# Patient Record
Sex: Male | Born: 1965
Health system: Southern US, Community
[De-identification: ages and names within clinical notes are randomized; demographics above are authoritative.]

## PROBLEM LIST (undated history)

## (undated) DIAGNOSIS — Z87442 Personal history of urinary calculi: Secondary | ICD-10-CM

## (undated) DIAGNOSIS — E785 Hyperlipidemia, unspecified: Secondary | ICD-10-CM

## (undated) DIAGNOSIS — K219 Gastro-esophageal reflux disease without esophagitis: Secondary | ICD-10-CM

## (undated) DIAGNOSIS — M549 Dorsalgia, unspecified: Secondary | ICD-10-CM

## (undated) DIAGNOSIS — M1990 Unspecified osteoarthritis, unspecified site: Secondary | ICD-10-CM

## (undated) DIAGNOSIS — J302 Other seasonal allergic rhinitis: Secondary | ICD-10-CM

## (undated) DIAGNOSIS — H9319 Tinnitus, unspecified ear: Secondary | ICD-10-CM

## (undated) DIAGNOSIS — Z8619 Personal history of other infectious and parasitic diseases: Secondary | ICD-10-CM

## (undated) DIAGNOSIS — T7840XA Allergy, unspecified, initial encounter: Secondary | ICD-10-CM

## (undated) HISTORY — PX: TONSILLECTOMY: SUR1361

## (undated) HISTORY — DX: Personal history of other infectious and parasitic diseases: Z86.19

## (undated) HISTORY — DX: Hyperlipidemia, unspecified: E78.5

## (undated) HISTORY — DX: Allergy, unspecified, initial encounter: T78.40XA

---

## 2014-03-19 ENCOUNTER — Encounter: Payer: Self-pay | Admitting: Emergency Medicine

## 2014-03-19 ENCOUNTER — Emergency Department (INDEPENDENT_AMBULATORY_CARE_PROVIDER_SITE_OTHER)
Admission: EM | Admit: 2014-03-19 | Discharge: 2014-03-19 | Disposition: A | Discharge status: Home / Self Care | Payer: 59 | Source: Home / Self Care | Attending: Family Medicine | Admitting: Family Medicine

## 2014-03-19 DIAGNOSIS — J069 Acute upper respiratory infection, unspecified: Secondary | ICD-10-CM

## 2014-03-19 DIAGNOSIS — J029 Acute pharyngitis, unspecified: Secondary | ICD-10-CM

## 2014-03-19 HISTORY — DX: Dorsalgia, unspecified: M54.9

## 2014-03-19 HISTORY — DX: Other seasonal allergic rhinitis: J30.2

## 2014-03-19 HISTORY — DX: Tinnitus, unspecified ear: H93.19

## 2014-03-19 LAB — POCT RAPID STREP A (OFFICE): Rapid Strep A Screen: NEGATIVE

## 2014-03-19 MED ORDER — AZITHROMYCIN 250 MG PO TABS: ORAL_TABLET | ORAL | Status: DC

## 2014-03-19 NOTE — ED Provider Notes (Signed)
CSN: [id number]     Arrival [id number] & time 03/19/14  1103 History   First MD Initiated Contact with Patient 03/19/14 1232     Chief Complaint  Patient presents with  . Sore Throat  . Nasal Congestion      HPI Comments: Patient complains of two day history of sore throat, congestion, and cough.  The history is provided by the patient.    Past Medical History  Diagnosis Date  . Back ache   . Tinnitus   . Seasonal allergies    Past Surgical History  Procedure Laterality Date  . Tonsillectomy     Family History  Problem Relation Age of Onset  . Cancer Father   . Cancer Sister    History  Substance Use Topics  . Smoking status: Never Smoker   . Smokeless tobacco: Not on file  . Alcohol Use: No    Review of Systems + sore throat + cough No pleuritic pain No wheezing No nasal congestion ? post-nasal drainage No sinus pain/pressure No itchy/red eyes No earache No hemoptysis No SOB No fever/chills No nausea No vomiting No abdominal pain No diarrhea No urinary symptoms No skin rash + fatigue No myalgias No headache Used OTC meds without relief  Allergies  Review of patient's allergies indicates no known allergies.  Home Medications   Prior to Admission medications   Medication Sig Start Date End Date Taking? Authorizing Provider  azithromycin (ZITHROMAX Z-PAK) 250 MG tablet Take 2 tabs today; then begin one tab once daily for 4 more days. (Rx void after 03/27/14) 03/19/14   Lattie HawStephen A Velecia Ovitt, MD   Pulse 82  Temp(Src) 98.4 F (36.9 C) (Oral)  Ht 5\' 8"  (1.727 m)  Wt 212 lb (96.163 kg)  BMI 32.24 kg/m2  SpO2 95% Physical Exam Nursing notes and Vital Signs reviewed. Appearance:  Patient appears stated age, and in no acute distress.  Patient is obese (BMI 32.2) Eyes:  Pupils are equal, round, and reactive to light and accomodation.  Extraocular movement is intact.  Conjunctivae are not inflamed  Ears:  Canals normal.  Tympanic membranes normal.  Nose:  Mildly  congested turbinates.  No sinus tenderness.   Pharynx:   Minimal erythema Neck:  Supple.  Tender shotty posterior nodes are palpated bilaterally  Lungs:  Clear to auscultation.  Breath sounds are equal.  Heart:  Regular rate and rhythm without murmurs, rubs, or gallops.  Abdomen:  Nontender without masses or hepatosplenomegaly.  Bowel sounds are present.  No CVA or flank tenderness.  Extremities:  No edema.  No calf tenderness Skin:  No rash present.   ED Course  Procedures  none    Labs Reviewed  STREP A DNA PROBE  POCT RAPID STREP A (OFFICE) negative         MDM   1. Viral URI   2. Acute pharyngitis, unspecified pharyngitis type    Throat culture pending.  There is no evidence of bacterial infection today.     Take plain Mucinex (1200 mg guaifenesin) twice daily for cough and congestion.  May add Sudafed for sinus congestion.   Increase fluid intake, rest. May use Afrin nasal spray (or generic oxymetazoline) twice daily for about 5 days.  Also recommend using saline nasal spray several times daily and saline nasal irrigation (AYR is a common brand) Try warm salt water gargles for sore throat.  May take Delsym Cough Suppressant at bedtime for nighttime cough.  Stop all antihistamines for now, and other non-prescription  cough/cold preparations. May take Ibuprofen 200mg , 4 tabs every 8 hours with food for sore throat, fever, etc. Begin Azithromycin if not improving about one week or if persistent fever develops (Given a prescription to hold, with an expiration date)  Follow-up with family doctor if not improving about10 days.     Lattie Haw, MD 03/23/14 1451

## 2014-03-19 NOTE — Discharge Instructions (Signed)
Take plain Mucinex (1200 mg guaifenesin) twice daily for cough and congestion.  May add Sudafed for sinus congestion.   Increase fluid intake, rest. May use Afrin nasal spray (or generic oxymetazoline) twice daily for about 5 days.  Also recommend using saline nasal spray several times daily and saline nasal irrigation (AYR is a common brand) Try warm salt water gargles for sore throat.  May take Delsym Cough Suppressant at bedtime for nighttime cough.  Stop all antihistamines for now, and other non-prescription cough/cold preparations. May take Ibuprofen 200mg , 4 tabs every 8 hours with food for sore throat, fever, etc. Begin Azithromycin if not improving about one week or if persistent fever develops  Follow-up with family doctor if not improving about10 days.    Salt Water Gargle This solution will help make your mouth and throat feel better. HOME CARE INSTRUCTIONS   Mix 1 teaspoon of salt in 8 ounces of warm water.  Gargle with this solution as much or often as you need or as directed. Swish and gargle gently if you have any sores or wounds in your mouth.  Do not swallow this mixture. Document Released: 05/17/2004 Document Revised: 11/05/2011 Document Reviewed: 10/08/2008 [facility] Patient Information 2015 [employer], [place]. This information is not intended to replace advice given to you by your health care provider. Make sure you discuss any questions you have with your health care provider.

## 2014-03-19 NOTE — ED Notes (Signed)
Reports 2 days of sore throat, congestion and cough.

## 2014-03-20 LAB — STREP A DNA PROBE: GASP: NEGATIVE

## 2014-03-22 ENCOUNTER — Telehealth: Payer: Self-pay

## 2014-03-22 NOTE — ED Notes (Signed)
I spoke with patient and he is feeling only a little better. I advised him if he continues to feel bad to start the Zpack as directed by Dr Cathren HarshBeese. I advised for him to call us back if he has any questions or concerns.

## 2016-03-27 ENCOUNTER — Encounter: Payer: Self-pay | Admitting: Osteopathic Medicine

## 2016-03-27 ENCOUNTER — Ambulatory Visit (INDEPENDENT_AMBULATORY_CARE_PROVIDER_SITE_OTHER): Payer: PRIVATE HEALTH INSURANCE | Admitting: Osteopathic Medicine

## 2016-03-27 VITALS — BP 137/86 | HR 91 | Ht 68.0 in | Wt 220.0 lb

## 2016-03-27 DIAGNOSIS — IMO0002 Reserved for concepts with insufficient information to code with codable children: Secondary | ICD-10-CM

## 2016-03-27 DIAGNOSIS — Z0000 Encounter for general adult medical examination without abnormal findings: Secondary | ICD-10-CM

## 2016-03-27 DIAGNOSIS — E668 Other obesity: Secondary | ICD-10-CM

## 2016-03-27 DIAGNOSIS — M545 Low back pain, unspecified: Secondary | ICD-10-CM

## 2016-03-27 DIAGNOSIS — R946 Abnormal results of thyroid function studies: Secondary | ICD-10-CM

## 2016-03-27 DIAGNOSIS — R7989 Other specified abnormal findings of blood chemistry: Secondary | ICD-10-CM

## 2016-03-27 DIAGNOSIS — J309 Allergic rhinitis, unspecified: Secondary | ICD-10-CM

## 2016-03-27 MED ORDER — FLUTICASONE PROPIONATE 50 MCG/ACT NA SUSP: NASAL | 11 refills | Status: DC

## 2016-03-27 NOTE — Progress Notes (Signed)
HPI: Carlos Potts is a 50 y.o. Not Hispanic or Latino male  who presents to [facility] today, 03/27/16,  for chief complaint of:  Chief Complaint  Patient presents with  . Establish Care    ALLERGIES, COLD    Very pleasant new patient here to establish care. Has some concerns about allergies and sinus issues as well as arthritis  Allergies, sinus pressure: Patient has been dealing with venous/sinus congestion on and off for the past several weeks. Trying to manage as below with Claritin-D. Getting some relief but seems to be coming and going. No new residential or occupational allergy exposure that he is aware of. Reports possible intermittent wheezing, notices this a bit more night  Arthritis: Patient reports some chronic low back pain, not really bothering him at the moment, he admits that he could be better about exercise and is looking to lose weight. No history of major injury  Past medical, surgical, social and family history reviewed: Past Medical History:  Diagnosis Date  . Back ache   . Seasonal allergies   . Tinnitus    Past Surgical History:  Procedure Laterality Date  . TONSILLECTOMY     Social History  Substance Use Topics  . Smoking status: Never Smoker  . Smokeless tobacco: Not on file  . Alcohol use No   Family History  Problem Relation Age of Onset  . Cancer Father   . Cancer Sister      Current medication list and allergy/intolerance information reviewed:   Current Outpatient Prescriptions  Medication Sig Dispense Refill  . desloratadine-pseudoephedrine (CLARINEX-D 24-HOUR) 5-240 MG 24 hr tablet Take 1 tablet by mouth daily.    Marland Kitchen ibuprofen (ADVIL,MOTRIN) 600 MG tablet Take 600 mg by mouth every 6 (six) hours as needed.    . vitamin C (ASCORBIC ACID) 500 MG tablet Take 500 mg by mouth daily.     No current facility-administered medications for this visit.    No Known Allergies    Review of  Systems:  Constitutional:  No  fever, no chills, No recent illness, No unintentional weight changes. No significant fatigue.   HEENT: No  headache, no vision change, no hearing change, No sore throat, (+) sinus pressure, (+) tinnitus, (+) hay fever/allergies  Cardiac: No  chest pain, No  pressure, No palpitations, No  Orthopnea  Respiratory:  No  shortness of breath. No  Cough, (+) wheeze  Gastrointestinal: No  abdominal pain, No  nausea, No  vomiting,  No  blood in stool, No  diarrhea, No  constipation   Musculoskeletal: No new myalgia/arthralgia  Genitourinary: No  incontinence, No  abnormal genital bleeding, No abnormal genital discharge  Skin: No  Rash, No other wounds/concerning lesions  Hem/Onc: No  easy bruising/bleeding, No  abnormal lymph node  Endocrine: No cold intolerance,  No heat intolerance. No polyuria/polydipsia/polyphagia   Neurologic: No  weakness, No  dizziness, No  slurred speech/focal weakness/facial droop  Psychiatric: No  concerns with depression, No  concerns with anxiety, No sleep problems, No mood problems  Exam:  BP 137/86   Pulse 91   Ht 5\' 8"  (1.727 m)   Wt 220 lb (99.8 kg)   BMI 33.45 kg/m   Constitutional: VS see above. General Appearance: alert, well-developed, well-nourished, NAD  Eyes: Normal lids and conjunctive, non-icteric sclera  Ears, Nose, Mouth, Throat: MMM, Normal external inspection ears/nares/mouth/lips/gums. TM normal bilaterally. Pharynx/tonsils no erythema, no exudate. Nasal mucosa mildly pale.   Neck: No masses,  trachea midline. No thyroid enlargement. No tenderness/mass appreciated. No lymphadenopathy  Respiratory: Normal respiratory effort. no wheeze, no rhonchi, no rales  Cardiovascular: S1/S2 normal, no murmur, no rub/gallop auscultated. RRR. No lower extremity edema.    Musculoskeletal: Gait normal. No clubbing/cyanosis of digits.   Neurological:  Normal balance/coordination. No tremor.   Skin: warm, dry, intact.    Psychiatric: Normal judgment/insight. Normal mood and affect. Oriented x3.      ASSESSMENT/PLAN:   Due to intermittent nature of sinus symptoms, more suspicious for allergic rhinitis rather than infectious cause at this point. Go ahead and add nasal steroid and addition to antihistamine/decongestant. If no improvement on this regimen, could consider course of antibiotics after reevaluation  Patient advised on questioning thinning exercises to alleviate/prevent back pain. Weight loss encouraged, abdominal obesity can certainly contribute to back problems.  Labs as below for upcoming annual physical, annual preventive care exam was not billed today  Allergic rhinitis, unspecified allergic rhinitis type - Plan: desloratadine-pseudoephedrine (CLARINEX-D 24-HOUR) 5-240 MG 24 hr tablet, fluticasone (FLONASE) 50 MCG/ACT nasal spray  Bilateral low back pain without sciatica - Plan: ibuprofen (ADVIL,MOTRIN) 600 MG tablet  Adult BMI > 30  Annual physical exam - Plan: CBC with Differential/Platelet, COMPLETE METABOLIC PANEL WITH GFR, Lipid panel, Hepatitis C antibody, TSH     Visit summary with medication list and pertinent instructions was printed for patient to review. All questions at time of visit were answered - patient instructed to contact office with any additional concerns. ER/RTC precautions were reviewed with the patient. Follow-up plan: Return if symptoms worsen or fail to improve, and at your convenience for annual physical exam.  Note: Total time spent 30 minutes, greater than 50% of the visit was spent face-to-face counseling and coordinating care for the following: The primary encounter diagnosis was Allergic rhinitis, unspecified allergic rhinitis type. Diagnoses of Bilateral low back pain without sciatica, Adult BMI > 30, were also pertinent to this visit.Marland Kitchen

## 2016-03-28 DIAGNOSIS — M545 Low back pain, unspecified: Secondary | ICD-10-CM | POA: Insufficient documentation

## 2016-03-28 DIAGNOSIS — IMO0002 Reserved for concepts with insufficient information to code with codable children: Secondary | ICD-10-CM | POA: Insufficient documentation

## 2016-03-28 DIAGNOSIS — J309 Allergic rhinitis, unspecified: Secondary | ICD-10-CM | POA: Insufficient documentation

## 2016-04-02 LAB — COMPLETE METABOLIC PANEL WITH GFR
ALT: 31 U/L (ref 9–46)
AST: 20 U/L (ref 10–35)
Albumin: 4.6 g/dL (ref 3.6–5.1)
Alkaline Phosphatase: 73 U/L (ref 40–115)
BILIRUBIN TOTAL: 0.5 mg/dL (ref 0.2–1.2)
BUN: 16 mg/dL (ref 7–25)
CO2: 28 mmol/L (ref 20–31)
Calcium: 9.8 mg/dL (ref 8.6–10.3)
Chloride: 101 mmol/L (ref 98–110)
Creat: 0.91 mg/dL (ref 0.70–1.33)
GFR, Est African American: >89 mL/min (ref >=60)
GLUCOSE: 95 mg/dL (ref 65–99)
POTASSIUM: 4.6 mmol/L (ref 3.5–5.3)
SODIUM: 139 mmol/L (ref 135–146)
Total Protein: 7.2 g/dL (ref 6.1–8.1)

## 2016-04-02 LAB — CBC WITH DIFFERENTIAL/PLATELET
Basophils Absolute: 80 cells/uL (ref 0–200)
Basophils Relative: 1 %
Eosinophils Absolute: 320 cells/uL (ref 15–500)
Eosinophils Relative: 4 %
HCT: 45.8 % (ref 38.5–50.0)
Hemoglobin: 15.2 g/dL (ref 13.2–17.1)
Lymphocytes Relative: 34 %
Lymphs Abs: 2720 cells/uL (ref 850–3900)
MCH: 29.5 pg (ref 27.0–33.0)
MCHC: 33.2 g/dL (ref 32.0–36.0)
MCV: 88.9 fL (ref 80.0–100.0)
MONOS PCT: 9 %
MPV: 10.7 fL (ref 7.5–12.5)
Monocytes Absolute: 720 cells/uL (ref 200–950)
NEUTROS PCT: 52 %
Neutro Abs: 4160 cells/uL (ref 1500–7800)
PLATELETS: 250 10*3/uL (ref 140–400)
RBC: 5.15 MIL/uL (ref 4.20–5.80)
RDW: 13.7 % (ref 11.0–15.0)
WBC: 8 10*3/uL (ref 3.8–10.8)

## 2016-04-02 LAB — LIPID PANEL
Cholesterol: 169 mg/dL (ref 125–200)
HDL: 42 mg/dL (ref >=40)
LDL CALC: 101 mg/dL (ref <130)
TRIGLYCERIDES: 128 mg/dL (ref <150)
Total CHOL/HDL Ratio: 4 Ratio (ref <=5.0)
VLDL: 26 mg/dL (ref <30)

## 2016-04-02 LAB — TSH: TSH: 5.06 mIU/L — ABNORMAL HIGH (ref 0.40–4.50)

## 2016-04-03 LAB — HEPATITIS C ANTIBODY: HCV Ab: NEGATIVE

## 2016-04-03 NOTE — Addendum Note (Signed)
Addended by: Deirdre PippinsALEXANDER, Ashford Clouse M on: 04/03/2016 08:22 AM   Modules accepted: Orders

## 2016-04-04 LAB — T4, FREE: Free T4: 1 ng/dL (ref 0.8–1.8)

## 2016-04-04 LAB — T3, FREE: T3, Free: 3.1 pg/mL (ref 2.3–4.2)

## 2016-04-27 ENCOUNTER — Encounter: Payer: Self-pay | Admitting: Osteopathic Medicine

## 2016-04-27 ENCOUNTER — Ambulatory Visit (INDEPENDENT_AMBULATORY_CARE_PROVIDER_SITE_OTHER): Payer: PRIVATE HEALTH INSURANCE | Admitting: Osteopathic Medicine

## 2016-04-27 VITALS — BP 124/78 | HR 79 | Ht 68.0 in | Wt 219.0 lb

## 2016-04-27 DIAGNOSIS — R946 Abnormal results of thyroid function studies: Secondary | ICD-10-CM | POA: Diagnosis not present

## 2016-04-27 DIAGNOSIS — Z0000 Encounter for general adult medical examination without abnormal findings: Secondary | ICD-10-CM

## 2016-04-27 DIAGNOSIS — R7989 Other specified abnormal findings of blood chemistry: Secondary | ICD-10-CM | POA: Insufficient documentation

## 2016-04-27 DIAGNOSIS — Z23 Encounter for immunization: Secondary | ICD-10-CM | POA: Diagnosis not present

## 2016-04-27 NOTE — Progress Notes (Signed)
HPI: Carlos Potts is a 50 y.o. male  who presents to [place] today, 04/27/16,  for chief complaint of:  Chief Complaint  Patient presents with  . Annual Exam    Preventive care reviewed as below - no complaints today. Allergies are better.   ABNORMAL THYROID TESTING: TSH mild high, T4/T3 ok. No extreme fatigue, constipation, cold intolerance, hair/skin changes.    Past medical, surgical, social and family history reviewed: Past Medical History:  Diagnosis Date  . Back ache   . Seasonal allergies   . Tinnitus    Past Surgical History:  Procedure Laterality Date  . TONSILLECTOMY     Social History  Substance Use Topics  . Smoking status: Never Smoker  . Smokeless tobacco: Never Used  . Alcohol use No   Family History  Problem Relation Age of Onset  . Cancer Father   . Cancer Sister      Current medication list and allergy/intolerance information reviewed:   Current Outpatient Prescriptions  Medication Sig Dispense Refill  . desloratadine-pseudoephedrine (CLARINEX-D 24-HOUR) 5-240 MG 24 hr tablet Take 1 tablet by mouth daily.    . fluticasone (FLONASE) 50 MCG/ACT nasal spray Place 2 sprays into each nostril once or twice daily for allergic rhinitis 16 g 11  . ibuprofen (ADVIL,MOTRIN) 600 MG tablet Take 600 mg by mouth every 6 (six) hours as needed.    . vitamin C (ASCORBIC ACID) 500 MG tablet Take 500 mg by mouth daily.     No current facility-administered medications for this visit.    No Known Allergies    Review of Systems:  Constitutional:  No  fever, no chills, No recent illness, No unintentional weight changes. No significant fatigue.   HEENT: No  sinus pressure  Cardiac: No  chest pain, No  pressure, No palpitations  Respiratory:  No  shortness of breath. No  Cough  Gastrointestinal: No  abdominal pain  Musculoskeletal: No new myalgia/arthralgia  Skin: No  Rash  Hem/Onc: No  easy bruising/bleeding, No   abnormal lymph node  Endocrine: No cold intolerance,  No heat intolerance. No polyuria/polydipsia/polyphagia   Neurologic: No  weakness, No  dizziness,   Psychiatric: No  concerns with depression, No  concerns with anxiety, (+)sleep problems, No mood problems  Exam:  BP 124/78   Pulse 79   Ht '5\' 8"'$  (1.727 m)   Wt 219 lb (99.3 kg)   BMI 33.30 kg/m   Constitutional: VS see above. General Appearance: alert, well-developed, well-nourished, NAD  Eyes: Normal lids and conjunctive, non-icteric sclera  Ears, Nose, Mouth, Throat: MMM, Normal external inspection ears/nares/mouth/lips/gums.  Neck: No masses, trachea midline. No thyroid enlargement. No tenderness/mass appreciated. No lymphadenopathy  Respiratory: Normal respiratory effort. no wheeze, no rhonchi, no rales  Cardiovascular: S1/S2 normal, no murmur, no rub/gallop auscultated. RRR. No lower extremity edema.   Skin: warm, dry, intact. No rash/ulcer.   Psychiatric: Normal judgment/insight. Normal mood and affect. Oriented x3.    Recent Results (from the past 2160 hour(s))  CBC with Differential/Platelet     Status: None   Collection Time: 04/02/16  8:06 AM  Result Value Ref Range   WBC 8.0 3.8 - 10.8 K/uL   RBC 5.15 4.20 - 5.80 MIL/uL   Hemoglobin 15.2 13.2 - 17.1 g/dL   HCT 45.8 38.5 - 50.0 %   MCV 88.9 80.0 - 100.0 fL   MCH 29.5 27.0 - 33.0 pg   MCHC 33.2 32.0 - 36.0 g/dL  RDW 13.7 11.0 - 15.0 %   Platelets 250 140 - 400 K/uL   MPV 10.7 7.5 - 12.5 fL   Neutro Abs 4,160 1,500 - 7,800 cells/uL   Lymphs Abs 2,720 850 - 3,900 cells/uL   Monocytes Absolute 720 200 - 950 cells/uL   Eosinophils Absolute 320 15 - 500 cells/uL   Basophils Absolute 80 0 - 200 cells/uL   Neutrophils Relative % 52 %   Lymphocytes Relative 34 %   Monocytes Relative 9 %   Eosinophils Relative 4 %   Basophils Relative 1 %   Smear Review Criteria for review not met     Comment: ** Please note change in unit of measure and reference  range(s). **  COMPLETE METABOLIC PANEL WITH GFR     Status: None   Collection Time: 04/02/16  8:06 AM  Result Value Ref Range   Sodium 139 135 - 146 mmol/L   Potassium 4.6 3.5 - 5.3 mmol/L   Chloride 101 98 - 110 mmol/L   CO2 28 20 - 31 mmol/L   Glucose, Bld 95 65 - 99 mg/dL   BUN 16 7 - 25 mg/dL   Creat 0.91 0.70 - 1.33 mg/dL    Comment:   For patients > or = 52 years of age: The upper reference limit for Creatinine is approximately 13% higher for people identified as African-American.      Total Bilirubin 0.5 0.2 - 1.2 mg/dL   Alkaline Phosphatase 73 40 - 115 U/L   AST 20 10 - 35 U/L   ALT 31 9 - 46 U/L   Total Protein 7.2 6.1 - 8.1 g/dL   Albumin 4.6 3.6 - 5.1 g/dL   Calcium 9.8 8.6 - 10.3 mg/dL   GFR, Est African American >89 >=60 mL/min   GFR, Est Non African American >89 >=60 mL/min  Lipid panel     Status: None   Collection Time: 04/02/16  8:06 AM  Result Value Ref Range   Cholesterol 169 125 - 200 mg/dL   Triglycerides 128 <150 mg/dL   HDL 42 >=40 mg/dL   Total CHOL/HDL Ratio 4.0 <=5.0 Ratio   VLDL 26 <30 mg/dL   LDL Cholesterol 101 <130 mg/dL    Comment:   Total Cholesterol/HDL Ratio:CHD Risk                        Coronary Heart Disease Risk Table                                        Men       Women          1/2 Average Risk              3.4        3.3              Average Risk              5.0        4.4           2X Average Risk              9.6        7.1           3X Average Risk             23.4  11.0 Use the calculated Patient Ratio above and the CHD Risk table  to determine the patient's CHD Risk.   Hepatitis C antibody     Status: None   Collection Time: 04/02/16  8:06 AM  Result Value Ref Range   HCV Ab NEGATIVE NEGATIVE  TSH     Status: Abnormal   Collection Time: 04/02/16  8:06 AM  Result Value Ref Range   TSH 5.06 (H) 0.40 - 4.50 mIU/L  T4, free     Status: None   Collection Time: 04/03/16  8:20 AM  Result Value Ref Range   Free T4  1.0 0.8 - 1.8 ng/dL  T3, free     Status: None   Collection Time: 04/03/16  8:20 AM  Result Value Ref Range   T3, Free 3.1 2.3 - 4.2 pg/mL     ASSESSMENT/PLAN:   Annual physical exam  Need for diphtheria-tetanus-pertussis (Tdap) vaccine, adult/adolescent - Plan: Tdap vaccine greater than or equal to 7yo IM  Abnormal thyroid blood test - Plan: Thyroid Panel With TSH, Thyroid peroxidase antibody, Thyroglobulin Antibody Panel   MALE PREVENTIVE CARE updated 04/27/16  ANNUAL SCREENING/COUNSELING  Any changes to health in the past year? no  Tobacco - never   Alcohol - none  Diet/Exercise - Healthy habits discussed to decrease CV risk and promote overall health. Patient does not have dietary restrictions.   Depression - PQH2 Negative  Feel safe at home? - yes  HTN SCREENING - SEE [place]  Sexually active? - Yes with male.  STI testing needed/desired today? - no  Any concerns with testosterone/libido? - no  INFECTIOUS DISEASE SCREENING  HIV - does not need  GC/CT - does not need  HepC - does not need  TB - does not need  CANCER SCREENING  Lung - does not need  Colon - needs - patient prefers fecal FIT, would like to check with insurance prior to ordering this to make sure covered  Prostate - does not need  OTHER DISEASE SCREENING  Lipid - does not need  DM2 - does not need  Osteoporosis - does not need  ADULT VACCINATION  Influenza - needs today, annual vaccine recommended  Td - was given  Zoster - was not indicated  Pneumonia - was not indicated     Visit summary with medication list and pertinent instructions was printed for patient to review. All questions at time of visit were answered - patient instructed to contact office with any additional concerns. ER/RTC precautions were reviewed with the patient. Follow-up plan: Return in about 4 weeks (around 05/25/2016) for LABS - FOLLOW UP THYROID .

## 2016-07-27 ENCOUNTER — Encounter: Payer: Self-pay | Admitting: Osteopathic Medicine

## 2016-07-27 ENCOUNTER — Other Ambulatory Visit: Payer: Self-pay | Admitting: Osteopathic Medicine

## 2016-07-27 ENCOUNTER — Ambulatory Visit (INDEPENDENT_AMBULATORY_CARE_PROVIDER_SITE_OTHER): Payer: PRIVATE HEALTH INSURANCE | Admitting: Osteopathic Medicine

## 2016-07-27 VITALS — BP 124/76 | HR 73 | Ht 68.0 in | Wt 227.0 lb

## 2016-07-27 DIAGNOSIS — M19042 Primary osteoarthritis, left hand: Secondary | ICD-10-CM | POA: Diagnosis not present

## 2016-07-27 DIAGNOSIS — H0015 Chalazion left lower eyelid: Secondary | ICD-10-CM

## 2016-07-27 DIAGNOSIS — R7989 Other specified abnormal findings of blood chemistry: Secondary | ICD-10-CM

## 2016-07-27 DIAGNOSIS — M19041 Primary osteoarthritis, right hand: Secondary | ICD-10-CM

## 2016-07-27 DIAGNOSIS — R946 Abnormal results of thyroid function studies: Secondary | ICD-10-CM | POA: Diagnosis not present

## 2016-07-27 LAB — T4, FREE: Free T4: 1.1 ng/dL (ref 0.8–1.8)

## 2016-07-27 LAB — T3, FREE: T3, Free: 3.8 pg/mL (ref 2.3–4.2)

## 2016-07-27 NOTE — Patient Instructions (Signed)
Eye: I think this is a resolved infection, I don't think we need to drain anything from this. Continue the warm compresses and eyelid washing with baby shampoo as discussed in the office. Ophthalmology referral has been placed if this doesn't go away in the next 1 - 2 weeks, if it does go away you can cancel that appointment.   Arthritis: OK to continue Ibuprofen as needed and can add Tylenol [id number] mg (max 1000 mg 4 times per day).   Thyroid: Will call you with lab results and next steps!     Happy holidays! Let us know if there is anything else we can do for you!

## 2016-07-27 NOTE — Progress Notes (Signed)
HPI: Carlos PostRobert Cressler is a 50 y.o. male  who presents to [facility] today, 07/27/16,  for chief complaint of:  Chief Complaint  Patient presents with  . Stye    left eye    L eye problem . Context: No previous episodes of similar . Location: Left lower eyelid . Quality: Currently hard, round. Nontender/nonpainful. Nondraining. Patient states it was much larger and painful/inflamed but this improves with warm compresses over the first week or 2. He stops doing warm compresses and it has not fully resolved . Duration: 3 weeks total, has been stable for the past 1 week or so . Modifying factors: Warm compresses initially were helping . Assoc signs/symptoms: No vision changes, no eye pain,  Arthritis . Location: Mostly hands/fingers. Worse in left second digit at PIP  . Quality: Left second PIP, occasionally feels like it's locking in the morning dose he can spontaneously move it. Otherwise, sore intermittently.  . Timing:No morning stiffness. Pain worse with activity. . Context: No previous injury . Modifying factors: Ibuprofen few times a week, typically 400-600 mg  Abnormal thyroid: TSH mild elevated on routine labs, free T4 and T3 were fine, patient due for recheck labs. Reviewed labs with him today and asked Lane diagnosis of subclinical hypothyroidism versus incidental lab abnormality      Past medical, surgical, social and family history reviewed: Patient Active Problem List   Diagnosis Date Noted  . Abnormal thyroid blood test 04/27/2016  . Rhinitis, allergic 03/28/2016  . Bilateral low back pain without sciatica 03/28/2016  . Adult BMI > 30 03/28/2016   Past Surgical History:  Procedure Laterality Date  . TONSILLECTOMY     Social History  Substance Use Topics  . Smoking status: Never Smoker  . Smokeless tobacco: Never Used  . Alcohol use No   Family History  Problem Relation Age of Onset  . Cancer Father   . Cancer Sister       Current medication list and allergy/intolerance information reviewed:   Current Outpatient Prescriptions on File Prior to Visit  Medication Sig Dispense Refill  . desloratadine-pseudoephedrine (CLARINEX-D 24-HOUR) 5-240 MG 24 hr tablet Take 1 tablet by mouth daily.    . fluticasone (FLONASE) 50 MCG/ACT nasal spray Place 2 sprays into each nostril once or twice daily for allergic rhinitis 16 g 11  . ibuprofen (ADVIL,MOTRIN) 600 MG tablet Take 600 mg by mouth every 6 (six) hours as needed.    . vitamin C (ASCORBIC ACID) 500 MG tablet Take 500 mg by mouth daily.     No current facility-administered medications on file prior to visit.    No Known Allergies    Review of Systems:  Constitutional: No recent illness  HEENT: No  headache, no vision change  Cardiac: No  chest pain, No  pressure  Respiratory:  No  shortness of breath. No  Cough  Musculoskeletal: No new myalgia/arthralgia, +chronic arthritis pain  Skin: No  Rash except as noted HPI eyelid   Exam:  BP 124/76   Pulse 73   Ht 5\' 8"  (1.727 m)   Wt 227 lb (103 kg)   BMI 34.52 kg/m   Constitutional: VS see above. General Appearance: alert, well-developed, well-nourished, NAD  Eyes: Normal conjunctive, non-icteric sclera. Subcutaneous nodule left lower lid laterally. Nondraining. Nontender. No erythema. No edema. Internal lid appears small healing area on the opposite side of external nodule.  Ears, Nose, Mouth, Throat: MMM, Normal external inspection ears/nares/mouth/lips/gums.  Neck: No masses,  trachea midline.   Respiratory: Normal respiratory effort. no wheeze, no rhonchi, no rales  Cardiovascular: S1/S2 normal, no murmur, no rub/gallop auscultated. RRR.   Musculoskeletal: Gait normal. Symmetric and independent movement of all extremities. Normal range of motion in hand/finger joints. No joint effusion.  Neurological: Normal balance/coordination. No tremor.  Skin: warm, dry, intact.   Psychiatric: Normal  judgment/insight. Normal mood and affect. Oriented x3.    ASSESSMENT/PLAN:   Chalazion left lower eyelid - Plan: Ambulatory referral to Ophthalmology  Osteoarthritis of both hands, unspecified osteoarthritis type  Abnormal thyroid blood test    Patient Instructions  Eye: I think this is a resolved infection, I don't think we need to drain anything from this. Continue the warm compresses and eyelid washing with baby shampoo as discussed in the office. Ophthalmology referral has been placed if this doesn't go away in the next 1 - 2 weeks, if it does go away you can cancel that appointment.   Arthritis: OK to continue Ibuprofen as needed and can add Tylenol [phone] mg (max 1000 mg 4 times per day).   Thyroid: Will call you with lab results and next steps!     Happy holidays! Let us know if there is anything else we can do for you!      Visit summary with medication list and pertinent instructions was printed for patient to review. All questions at time of visit were answered - patient instructed to contact office with any additional concerns. ER/RTC precautions were reviewed with the patient. Follow-up plan: Return if symptoms worsen or fail to improve and depending on labs.

## 2016-08-17 ENCOUNTER — Telehealth: Payer: Self-pay

## 2016-08-17 NOTE — Telephone Encounter (Signed)
Patient called stated that the sty is getting bigger on his eye so he is requesting a referral for eye specialist. Please advise. Rhonda Cunningham,CMA

## 2016-08-21 NOTE — Telephone Encounter (Signed)
Ok can send urgent referral to Ophthalmology for dx stye

## 2016-11-10 ENCOUNTER — Encounter: Payer: Self-pay | Admitting: Emergency Medicine

## 2016-11-10 ENCOUNTER — Emergency Department
Admission: EM | Admit: 2016-11-10 | Discharge: 2016-11-10 | Disposition: A | Discharge status: Home / Self Care | Payer: PRIVATE HEALTH INSURANCE | Source: Home / Self Care | Attending: Family Medicine | Admitting: Family Medicine

## 2016-11-10 DIAGNOSIS — R112 Nausea with vomiting, unspecified: Secondary | ICD-10-CM | POA: Diagnosis not present

## 2016-11-10 DIAGNOSIS — R197 Diarrhea, unspecified: Secondary | ICD-10-CM

## 2016-11-10 HISTORY — DX: Unspecified osteoarthritis, unspecified site: M19.90

## 2016-11-10 MED ORDER — ONDANSETRON 4 MG PO TBDP
ORAL_TABLET | ORAL | 0 refills | Status: DC
Start: 2016-11-10 — End: 2016-11-22

## 2016-11-10 MED ORDER — ONDANSETRON 4 MG PO TBDP
4.0000 mg | ORAL_TABLET | Freq: Once | ORAL | Status: AC
  Administered 2016-11-10: 4 mg via ORAL

## 2016-11-10 NOTE — ED Triage Notes (Signed)
Pt c/o diarrhea, vomitting, started last night, all night with cold sweats, ate dinner around 6pm then started with sxs around 11:45pm.  Discomfort in abdomen and leg cramping.

## 2016-11-10 NOTE — Discharge Instructions (Signed)

## 2016-11-10 NOTE — ED Provider Notes (Signed)
[facility]    CSN: [id number] Arrival date & time: 11/10/16  0919     History   Chief Complaint Chief Complaint  Patient presents with  . Diarrhea    HPI Carlos Potts is a 51 y.o. male.   At about 11:30pm yesterday, patient developed watery diarrhea followed by two other episodes during the night.  He developed nausea, and vomited three times during the night.  He had another episode of diarrhea at 7am.  He is able to take fluids.  He had chills, fatigue, and leg cramps during the night.   He has had colicky abdominal discomfort.  No urinary symptoms.  Denies recent foreign travel, or drinking untreated water in a wilderness environment.  He denies recent antibiotic use.    The history is provided by the patient and the spouse.  Diarrhea  Quality:  Watery Severity:  Mild Onset quality:  Sudden Number of episodes:  3 Timing:  Sporadic Progression:  Partially resolved Relieved by:  None tried Worsened by:  Nothing Ineffective treatments:  None tried Associated symptoms: abdominal pain, chills, diaphoresis and vomiting   Associated symptoms: no arthralgias, no recent cough, no fever, no headaches, no myalgias and no URI   Risk factors: no recent antibiotic use, no sick contacts, no suspicious food intake and no travel to endemic areas     Past Medical History:  Diagnosis Date  . Arthritis   . Back ache   . Seasonal allergies   . Tinnitus     Patient Active Problem List   Diagnosis Date Noted  . Chalazion left lower eyelid 07/27/2016  . Osteoarthritis of both hands 07/27/2016  . Abnormal thyroid blood test 04/27/2016  . Rhinitis, allergic 03/28/2016  . Bilateral low back pain without sciatica 03/28/2016  . Adult BMI > 30 03/28/2016    Past Surgical History:  Procedure Laterality Date  . TONSILLECTOMY         Home Medications    Prior to Admission medications   Medication Sig Start Date End Date Taking? Authorizing Provider  ibuprofen  (ADVIL,MOTRIN) 600 MG tablet Take 600 mg by mouth every 6 (six) hours as needed.    Historical Provider, MD  ondansetron (ZOFRAN ODT) 4 MG disintegrating tablet Take one tab by mouth Q6hr prn nausea.  Dissolve under tongue. 11/10/16   Lattie Haw, MD    Family History Family History  Problem Relation Age of Onset  . Cancer Father   . Cancer Sister     Social History Social History  Substance Use Topics  . Smoking status: Never Smoker  . Smokeless tobacco: Never Used  . Alcohol use No     Allergies   Patient has no known allergies.   Review of Systems Review of Systems  Constitutional: Positive for chills and diaphoresis. Negative for fever.  HENT: Negative.   Eyes: Negative.   Respiratory: Negative.   Cardiovascular: Negative.   Gastrointestinal: Positive for abdominal distention, abdominal pain, diarrhea, nausea and vomiting. Negative for blood in stool and rectal pain.  Genitourinary: Negative.   Musculoskeletal: Negative for arthralgias and myalgias.  Neurological: Negative for headaches.  All other systems reviewed and are negative.    Physical Exam Triage Vital Signs ED Triage Vitals  Enc Vitals Group     BP 11/10/16 0953 130/89     Pulse Rate 11/10/16 0953 (!) 114     Resp --      Temp 11/10/16 0953 98.7 F (37.1 C)  Temp Source 11/10/16 0953 Oral     SpO2 11/10/16 0953 96 %     Weight 11/10/16 0953 224 lb 4 oz (101.7 kg)     Height 11/10/16 0953 5\' 8"  (1.727 m)     Head Circumference --      Peak Flow --      Pain Score 11/10/16 0954 3     Pain Loc --      Pain Edu? --      Excl. in GC? --    No data found.   Updated Vital Signs BP 130/89 (BP Location: Left Arm)   Pulse (!) 114   Temp 98.7 F (37.1 C) (Oral)   Ht 5\' 8"  (1.727 m)   Wt 224 lb 4 oz (101.7 kg)   SpO2 96%   BMI 34.10 kg/m   Visual Acuity Right Eye Distance:   Left Eye Distance:   Bilateral Distance:    Right Eye Near:   Left Eye Near:    Bilateral Near:      Physical Exam Nursing notes and Vital Signs reviewed. Appearance:  Patient appears stated age, and in no acute distress Eyes:  Pupils are equal, round, and reactive to light and accomodation.  Extraocular movement is intact.  Conjunctivae are not inflamed   Nose:   Normal Pharynx:  Normal; moist mucous membranes  Neck:  Supple.   No adenopathy. Lungs:  Clear to auscultation.  Breath sounds are equal.  Moving air well. Heart:  Regular rate and rhythm without murmurs, rubs, or gallops.  Abdomen:  Nontender without masses or hepatosplenomegaly.  Bowel sounds are present.  No CVA or flank tenderness.  Extremities:  No edema.  Skin:  No rash present.    UC Treatments / Results  Labs (all labs ordered are listed, but only abnormal results are displayed) Labs Reviewed - No data to display  EKG  EKG Interpretation None       Radiology No results found.  Procedures Procedures (including critical care time)  Medications Ordered in UC Medications  ondansetron (ZOFRAN-ODT) disintegrating tablet 4 mg (not administered)     Initial Impression / Assessment and Plan / UC Course  I have reviewed the triage vital signs and the nursing notes.  Pertinent labs & imaging results that were available during my care of the patient were reviewed by me and considered in my medical decision making (see chart for details).    Suspect viral gastroenteritis. Administered Zofran ODT 4mg  PO.  Given Rx for same. Begin clear liquids (Pedialyte while having diarrhea) until improved, then advance to a [employer] (Bananas, Rice, Applesauce, Toast).  Then gradually resume a regular diet when tolerated.  Avoid milk products until well.  To decrease diarrhea, mix one teaspoon Citrucel (methylcellulose) in 2 oz water and drink one to three times daily.  Do not drink extra fluids with this dose and do not drink fluids for one hour afterwards.  When stools become more formed, may take Imodium (loperamide) once  or twice daily to decrease stool frequency.  If symptoms become significantly worse during the night or over the weekend, proceed to the local emergency room.  Followup with Family Doctor if not improved in about 4 days    Final Clinical Impressions(s) / UC Diagnoses   Final diagnoses:  Nausea vomiting and diarrhea    New Prescriptions New Prescriptions   ONDANSETRON (ZOFRAN ODT) 4 MG DISINTEGRATING TABLET    Take one tab by mouth Q6hr prn nausea.  Dissolve  under tongue.     Lattie HawStephen A Beese, MD 11/10/16 [phone]

## 2016-11-12 ENCOUNTER — Telehealth: Payer: Self-pay

## 2016-11-12 NOTE — Telephone Encounter (Signed)
Feeling better, symptoms are resolved and has started eating more solid foods.

## 2016-11-22 ENCOUNTER — Encounter: Payer: Self-pay | Admitting: *Deleted

## 2016-11-22 ENCOUNTER — Emergency Department
Admission: EM | Admit: 2016-11-22 | Discharge: 2016-11-22 | Disposition: A | Discharge status: Home / Self Care | Payer: PRIVATE HEALTH INSURANCE | Source: Home / Self Care | Attending: Family Medicine | Admitting: Family Medicine

## 2016-11-22 DIAGNOSIS — J101 Influenza due to other identified influenza virus with other respiratory manifestations: Secondary | ICD-10-CM

## 2016-11-22 DIAGNOSIS — R252 Cramp and spasm: Secondary | ICD-10-CM

## 2016-11-22 LAB — POCT CBC W AUTO DIFF (K'VILLE URGENT CARE)

## 2016-11-22 LAB — POCT INFLUENZA A/B
Influenza A, POC: NEGATIVE
Influenza B, POC: POSITIVE — AB

## 2016-11-22 MED ORDER — OSELTAMIVIR PHOSPHATE 75 MG PO CAPS: 75.0000 mg | ORAL_CAPSULE | Freq: Two times a day (BID) | ORAL | 0 refills | Status: DC

## 2016-11-22 MED ORDER — CYCLOBENZAPRINE HCL 5 MG PO TABS: 5.0000 mg | ORAL_TABLET | Freq: Three times a day (TID) | ORAL | 0 refills | Status: DC | PRN

## 2016-11-22 MED ORDER — GUAIFENESIN-CODEINE 100-10 MG/5ML PO SYRP: 5.0000 mL | ORAL_SOLUTION | Freq: Three times a day (TID) | ORAL | 0 refills | Status: DC | PRN

## 2016-11-22 NOTE — ED Provider Notes (Signed)
CSN: [id number]     Arrival date & time 11/22/16  4098 History   First MD Initiated Contact with Patient 11/22/16 0845     Chief Complaint  Patient presents with  . Back Pain  . Cough  . Leg Pain  . Muscle Cramping   (Consider location/radiation/quality/duration/timing/severity/associated sxs/prior Treatment) HPI  Carlos Potts is a 51 y.o. male presenting to UC with c/o 3 days of low grade fever Tmax 100.6*F, mild intermittent productive cough with intermittent lower back muscle spasms and bilateral leg cramping.  Pt was seen at [facility] 1 week ago for GI symptoms and leg cramps.  The GI symptoms resolved but leg cramps are still present.  He has been eating a bland diet and drinking Pedialyte.  He recently went out of town for work and when he came back he felt sick again.  Denies known sick contacts. Denies getting the flu vaccine this year.  He has been taking Excedrin and ibuprofen with mild temporary relief.    Past Medical History:  Diagnosis Date  . Arthritis   . Back ache   . Seasonal allergies   . Tinnitus    Past Surgical History:  Procedure Laterality Date  . TONSILLECTOMY     Family History  Problem Relation Age of Onset  . Cancer Father   . Cancer Sister    Social History  Substance Use Topics  . Smoking status: Never Smoker  . Smokeless tobacco: Never Used  . Alcohol use No    Review of Systems  Constitutional: Positive for fatigue and fever. Negative for chills.  HENT: Positive for congestion and rhinorrhea. Negative for ear pain, sore throat, trouble swallowing and voice change.   Respiratory: Positive for cough. Negative for shortness of breath.   Cardiovascular: Negative for chest pain and palpitations.  Gastrointestinal: Negative for abdominal pain, diarrhea, nausea and vomiting.  Musculoskeletal: Positive for back pain and myalgias. Negative for arthralgias.  Skin: Negative for rash.  Neurological: Positive for headaches. Negative for dizziness and  light-headedness.    Allergies  Patient has no known allergies.  Home Medications   Prior to Admission medications   Medication Sig Start Date End Date Taking? Authorizing Provider  cyclobenzaprine (FLEXERIL) 5 MG tablet Take 1 tablet (5 mg total) by mouth 3 (three) times daily as needed for muscle spasms. 11/22/16   Junius Finner, PA-C  guaiFENesin-codeine (ROBITUSSIN AC) 100-10 MG/5ML syrup Take 5-10 mLs by mouth 3 (three) times daily as needed for cough or congestion. 11/22/16   Junius Finner, PA-C  ibuprofen (ADVIL,MOTRIN) 600 MG tablet Take 600 mg by mouth every 6 (six) hours as needed.    Historical Provider, MD  oseltamivir (TAMIFLU) 75 MG capsule Take 1 capsule (75 mg total) by mouth every 12 (twelve) hours. 11/22/16   Junius Finner, PA-C   Meds Ordered and Administered this Visit  Medications - No data to display  BP 119/85 (BP Location: Left Arm)   Pulse (!) 103   Temp 98.6 F (37 C) (Oral)   Resp 16   Wt 216 lb (98 kg)   SpO2 96%   BMI 32.84 kg/m  No data found.   Physical Exam  Constitutional: He appears well-developed and well-nourished. No distress.  HENT:  Head: Normocephalic and atraumatic.  Right Ear: Tympanic membrane normal.  Left Ear: Tympanic membrane normal.  Nose: Nose normal.  Mouth/Throat: Uvula is midline, oropharynx is clear and moist and mucous membranes are normal.  Eyes: Conjunctivae are normal. No scleral icterus.  Neck: Normal range of motion. Neck supple.  Cardiovascular: Normal rate, regular rhythm and normal heart sounds.   Pulmonary/Chest: Effort normal and breath sounds normal. No stridor. No respiratory distress. He has no wheezes. He has no rales.  Abdominal: Soft. He exhibits no distension and no mass. There is no tenderness. There is no rebound and no guarding.  Musculoskeletal: Normal range of motion. He exhibits tenderness ( lower lumbar muscles, Left side > Right ). He exhibits no edema.  Lymphadenopathy:    He has no cervical  adenopathy.  Neurological: He is alert.  Skin: Skin is warm and dry. He is not diaphoretic.  Nursing note and vitals reviewed.   Urgent Care Course     Procedures (including critical care time)  Labs Review Labs Reviewed  POCT INFLUENZA A/B - Abnormal; Notable for the following:       Result Value   Influenza B, POC Positive (*)    All other components within normal limits  COMPLETE METABOLIC PANEL WITH GFR  SEDIMENTATION RATE  C-REACTIVE PROTEIN  POCT CBC W AUTO DIFF ([facility])    Imaging Review No results found.   MDM   1. Influenza B   2. Muscle cramps    Rapid flu: POSITIVE B Labs: CBC- WNL,  CMP, Sed Rate, and CRP- pending (performed due to continued c/w bilateral leg cramping)   Rx: Tamiflu, flexeril, and Virtussin Encouraged to use caution when taking flexeril and Virtussin around same time due to risk of drowsiness. Advised not to drive or drink alcohol while taking.   Encouraged fluids and rest. f/u with PCP in 1 week if not improving, sooner if worsening.      Junius FinnerErin O'Malley, PA-C 11/22/16 [phone]

## 2016-11-22 NOTE — ED Triage Notes (Signed)
Patient c/o 3 days of low grade fever and cough, productive at times. Also c/o back pain and pain in his legs with cramping. He has had this leg cramping for >1 week.

## 2016-11-22 NOTE — Discharge Instructions (Signed)
°  Virtussin (guaifenesin-codeine) is a strong narcotic cough medication.  Only take up to 3 times daily as needed for severe cough.  Be sure to take with large glass of water.  It may cause drowsiness. Do not drive, drink alcohol or take other sedating medications such as Nyquil while taking this medication.   Oseltamivir (Tamiflu) is an antiviral medication that can help decrease symptoms of the flu by about 1 days and lessen severity of symptoms.  It is best to start this medication within first 48 hours of symptoms, however, some studies have shown relief even after the 48 hour time frame.  This medication may cause stomach upset including nausea, vomiting and diarrhea.  It may also cause dizziness or hallucinations in children.  To help prevent stomach upset, you may take this medication with food.  If you are still having unwanted symptoms, you may stop taking this medication as it is not as important to finish the entire course like antibiotics.  If you have questions/concerns please call our office or follow up with your primary care provider.

## 2016-11-23 ENCOUNTER — Telehealth: Payer: Self-pay | Admitting: *Deleted

## 2016-11-23 LAB — COMPLETE METABOLIC PANEL WITH GFR
ALT: 75 U/L — ABNORMAL HIGH (ref 9–46)
AST: 54 U/L — ABNORMAL HIGH (ref 10–35)
Albumin: 4.7 g/dL (ref 3.6–5.1)
Alkaline Phosphatase: 70 U/L (ref 40–115)
BUN: 11 mg/dL (ref 7–25)
CO2: 28 mmol/L (ref 20–31)
Calcium: 9.3 mg/dL (ref 8.6–10.3)
Chloride: 98 mmol/L (ref 98–110)
Creat: 1.13 mg/dL (ref 0.70–1.33)
GFR, Est African American: 86 mL/min (ref >=60)
GFR, Est Non African American: 74 mL/min (ref >=60)
Glucose, Bld: 107 mg/dL — ABNORMAL HIGH (ref 65–99)
Potassium: 5.1 mmol/L (ref 3.5–5.3)
Sodium: 136 mmol/L (ref 135–146)
Total Bilirubin: 0.5 mg/dL (ref 0.2–1.2)
Total Protein: 7.8 g/dL (ref 6.1–8.1)

## 2016-11-23 LAB — C-REACTIVE PROTEIN: CRP: 19.4 mg/L — ABNORMAL HIGH (ref <8.0)

## 2016-11-23 LAB — SEDIMENTATION RATE: Sed Rate: 7 mm/hr (ref 0–20)

## 2016-11-23 NOTE — Telephone Encounter (Signed)
Callback: Patient reports he is still fatigued and lightheaded at times, decreased appetite. Labs results given and discussed. Encouraged f/u with PCP when well to ensure labs have normalized. Encouraged fluids.

## 2016-11-23 NOTE — Telephone Encounter (Signed)
-----   Message from Lattie Haw, MD sent at 11/23/2016  4:04 PM EDT ----- Mildly elevated AST and ALT most likely a result of recent viral infection.  Elevated CRP non-specific; recommend follow-up with PCP.

## 2016-12-09 ENCOUNTER — Emergency Department
Admission: EM | Admit: 2016-12-09 | Discharge: 2016-12-09 | Disposition: A | Discharge status: Home / Self Care | Payer: PRIVATE HEALTH INSURANCE | Source: Home / Self Care | Attending: Family Medicine | Admitting: Family Medicine

## 2016-12-09 ENCOUNTER — Encounter: Payer: Self-pay | Admitting: Emergency Medicine

## 2016-12-09 DIAGNOSIS — B9789 Other viral agents as the cause of diseases classified elsewhere: Secondary | ICD-10-CM

## 2016-12-09 DIAGNOSIS — J069 Acute upper respiratory infection, unspecified: Secondary | ICD-10-CM | POA: Diagnosis not present

## 2016-12-09 MED ORDER — CYCLOBENZAPRINE HCL 5 MG PO TABS: 5.0000 mg | ORAL_TABLET | Freq: Three times a day (TID) | ORAL | 0 refills | Status: DC | PRN

## 2016-12-09 MED ORDER — BENZONATATE 200 MG PO CAPS: ORAL_CAPSULE | ORAL | 0 refills | Status: DC

## 2016-12-09 MED ORDER — AZITHROMYCIN 250 MG PO TABS: ORAL_TABLET | ORAL | 0 refills | Status: DC

## 2016-12-09 NOTE — Discharge Instructions (Signed)
Take plain guaifenesin (  extended release tabs such as Mucinex) twice daily, with plenty of water, for cough and congestion.  May add Phenylephrine as needed for sinus congestion.  Get adequate rest.   May use Afrin nasal spray (or generic oxymetazoline) each morning for about 5 days and then discontinue.  Also recommend using saline nasal spray several times daily and saline nasal irrigation (AYR is a common brand).  Use Flonase nasal spray each morning after using Afrin nasal spray and saline nasal irrigation. Try warm salt water gargles for sore throat.  Stop all antihistamines for now, and other non-prescription cough/cold preparations. May take Ibuprofen , 4 tabs every 8 hours with food for body aches, headache, etc. Begin Azithromycin if not improving about one week or if persistent fever develops   Follow-up with family doctor if not improving about10 days.

## 2016-12-09 NOTE — ED Provider Notes (Signed)
[facility]    CSN: [id number] Arrival date & time: 12/09/16  1221     History   Chief Complaint Chief Complaint  Patient presents with  . Cough    HPI Carlos Potts is a 51 y.o. male.   Patient complains of onset of a non-productive cough 3 days ago with sinus congestion and occasional wheezing.  No sore throat.  No fevers, chills, and sweats.  No pleuritic pain.   The history is provided by the patient.    Past Medical History:  Diagnosis Date  . Arthritis   . Back ache   . Seasonal allergies   . Tinnitus     Patient Active Problem List   Diagnosis Date Noted  . Chalazion left lower eyelid 07/27/2016  . Osteoarthritis of both hands 07/27/2016  . Abnormal thyroid blood test 04/27/2016  . Rhinitis, allergic 03/28/2016  . Bilateral low back pain without sciatica 03/28/2016  . Adult BMI > 30 03/28/2016    Past Surgical History:  Procedure Laterality Date  . TONSILLECTOMY         Home Medications    Prior to Admission medications   Medication Sig Start Date End Date Taking? Authorizing Provider  azithromycin (ZITHROMAX Z-PAK) 250 MG tablet Take 2 tabs today; then begin one tab once daily for 4 more days. (Rx void after 12/17/16) 12/09/16   Lattie Haw, MD  benzonatate (TESSALON) 200 MG capsule Take one cap by mouth at bedtime as needed for cough.  May repeat in 4 to 6 hours 12/09/16   Lattie Haw, MD  cyclobenzaprine (FLEXERIL) 5 MG tablet Take 1 tablet (5 mg total) by mouth 3 (three) times daily as needed for muscle spasms. 12/09/16   Lattie Haw, MD    Family History Family History  Problem Relation Age of Onset  . Cancer Father   . Cancer Sister     Social History Social History  Substance Use Topics  . Smoking status: Never Smoker  . Smokeless tobacco: Never Used  . Alcohol use No     Allergies   Patient has no known allergies.   Review of Systems Review of Systems No sore throat + cough No pleuritic pain ?  wheezing + mild nasal congestion ? post-nasal drainage No sinus pain/pressure No itchy/red eyes ? earache No hemoptysis No SOB No fever, + chills No nausea No vomiting No abdominal pain No diarrhea No urinary symptoms No skin rash + fatigue No myalgias No headache Used OTC meds without relief   Physical Exam Triage Vital Signs ED Triage Vitals [12/09/16 1316]  Enc Vitals Group     BP (!) 131/91     Pulse Rate 90     Resp      Temp 98.1 F (36.7 C)     Temp src      SpO2 96 %     Weight 213 lb (96.6 kg)     Height  (1.727 m)     Head Circumference      Peak Flow      Pain Score 0     Pain Loc      Pain Edu?      Excl. in GC?    No data found.   Updated Vital Signs BP (!) 131/91 (BP Location: Left Arm)   Pulse 90   Temp 98.1 F (36.7 C)   Ht  (1.727 m)   Wt 213 lb (96.6 kg)   SpO2 96%  BMI 32.39 kg/m   Visual Acuity Right Eye Distance:   Left Eye Distance:   Bilateral Distance:    Right Eye Near:   Left Eye Near:    Bilateral Near:     Physical Exam Nursing notes and Vital Signs reviewed. Appearance:  Patient appears stated age, and in no acute distress Eyes:  Pupils are equal, round, and reactive to light and accomodation.  Extraocular movement is intact.  Conjunctivae are not inflamed  Ears:  Canals normal.  Tympanic membranes normal.  Nose:  Mildly congested turbinates.  No sinus tenderness.   Pharynx:  Normal Neck:  Supple.  Tender enlarged posterior/lateral nodes are palpated bilaterally  Lungs:  Clear to auscultation.  Breath sounds are equal.  Moving air well. Heart:  Regular rate and rhythm without murmurs, rubs, or gallops.  Abdomen:  Nontender without masses or hepatosplenomegaly.  Bowel sounds are present.  No CVA or flank tenderness.  Extremities:  No edema.  Skin:  No rash present.    UC Treatments / Results  Labs (all labs ordered are listed, but only abnormal results are displayed) Labs Reviewed - No data to  display  EKG  EKG Interpretation None       Radiology No results found.  Procedures Procedures (including critical care time)  Medications Ordered in UC Medications - No data to display   Initial Impression / Assessment and Plan / UC Course  I have reviewed the triage vital signs and the nursing notes.  Pertinent labs & imaging results that were available during my care of the patient were reviewed by me and considered in my medical decision making (see chart for details).    There is no evidence of bacterial infection today.   Treat symptomatically for now: Prescription written for Benzonatate [facility]) to take at bedtime for night-time cough.  Take plain guaifenesin (  extended release tabs such as Mucinex) twice daily, with plenty of water, for cough and congestion.  May add Phenylephrine as needed for sinus congestion.  Get adequate rest.   May use Afrin nasal spray (or generic oxymetazoline) each morning for about 5 days and then discontinue.  Also recommend using saline nasal spray several times daily and saline nasal irrigation (AYR is a common brand).  Use Flonase nasal spray each morning after using Afrin nasal spray and saline nasal irrigation. Try warm salt water gargles for sore throat.  Stop all antihistamines for now, and other non-prescription cough/cold preparations. May take Ibuprofen , 4 tabs every 8 hours with food for body aches, headache, etc. Begin Azithromycin if not improving about one week or if persistent fever develops (Given a prescription to hold, with an expiration date)  Follow-up with family doctor if not improving about10 days.  Will refill Flexeril at patient's request.    Final Clinical Impressions(s) / UC Diagnoses   Final diagnoses:  Viral URI with cough    New Prescriptions New Prescriptions   AZITHROMYCIN (ZITHROMAX Z-PAK) 250 MG TABLET    Take 2 tabs today; then begin one tab once daily for 4 more days. (Rx void after  12/17/16)   BENZONATATE (TESSALON) 200 MG CAPSULE    Take one cap by mouth at bedtime as needed for cough.  May repeat in 4 to 6 hours     Lattie Haw, MD 12/13/16 1807

## 2016-12-09 NOTE — ED Triage Notes (Signed)
Pt states that he has had a non-productive cough x 3 days, green sputum comes up every now and again, stuffy nose, no ear pain.

## 2017-02-28 ENCOUNTER — Encounter: Payer: Self-pay | Admitting: Osteopathic Medicine

## 2017-02-28 ENCOUNTER — Telehealth: Payer: Self-pay | Admitting: Osteopathic Medicine

## 2017-02-28 NOTE — Telephone Encounter (Signed)
Patient called and requested documentation that he had an annual physical and blood work done. Let her was printed,. Patient states that his company did not give him any specific paperwork for us to fill out. We will leave the above items up front for him to pick up and if any additional paperwork is required he is instructed to give this to us.

## 2017-04-11 ENCOUNTER — Telehealth: Payer: Self-pay

## 2017-04-11 MED ORDER — CYCLOBENZAPRINE HCL 5 MG PO TABS: 5.0000 mg | ORAL_TABLET | Freq: Three times a day (TID) | ORAL | 0 refills | Status: DC | PRN

## 2017-04-11 NOTE — Telephone Encounter (Signed)
Carlos MaduroRobert called and requested a refill on Flexeril. He has had an increase in back spasms. Medication has never been filled by Dr Lyn HollingsheadAlexander only Dr Cathren HarshBeese. Please advise.

## 2017-04-11 NOTE — Telephone Encounter (Signed)
No problem, refilling.

## 2017-04-12 ENCOUNTER — Other Ambulatory Visit: Payer: Self-pay

## 2017-04-12 MED ORDER — CYCLOBENZAPRINE HCL 5 MG PO TABS: 5.0000 mg | ORAL_TABLET | Freq: Three times a day (TID) | ORAL | 0 refills | Status: DC | PRN

## 2017-04-12 NOTE — Telephone Encounter (Signed)
Left message stating medication has been refilled.

## 2017-05-08 ENCOUNTER — Other Ambulatory Visit: Payer: Self-pay | Admitting: *Deleted

## 2017-05-08 MED ORDER — CYCLOBENZAPRINE HCL 5 MG PO TABS: 5.0000 mg | ORAL_TABLET | Freq: Three times a day (TID) | ORAL | 0 refills | Status: DC | PRN

## 2017-08-03 ENCOUNTER — Emergency Department (HOSPITAL_BASED_OUTPATIENT_CLINIC_OR_DEPARTMENT_OTHER)
Admission: EM | Admit: 2017-08-03 | Discharge: 2017-08-03 | Disposition: A | Discharge status: Home / Self Care | Payer: PRIVATE HEALTH INSURANCE | Attending: Physician Assistant | Admitting: Physician Assistant

## 2017-08-03 ENCOUNTER — Other Ambulatory Visit: Payer: Self-pay

## 2017-08-03 ENCOUNTER — Emergency Department (HOSPITAL_BASED_OUTPATIENT_CLINIC_OR_DEPARTMENT_OTHER): Payer: PRIVATE HEALTH INSURANCE

## 2017-08-03 ENCOUNTER — Encounter (HOSPITAL_BASED_OUTPATIENT_CLINIC_OR_DEPARTMENT_OTHER): Payer: Self-pay | Admitting: Emergency Medicine

## 2017-08-03 DIAGNOSIS — N200 Calculus of kidney: Secondary | ICD-10-CM | POA: Insufficient documentation

## 2017-08-03 DIAGNOSIS — R1032 Left lower quadrant pain: Secondary | ICD-10-CM | POA: Diagnosis present

## 2017-08-03 LAB — CBC
HCT: 46.2 % (ref 39.0–52.0)
HEMOGLOBIN: 15.7 g/dL (ref 13.0–17.0)
MCH: 30.1 pg (ref 26.0–34.0)
MCHC: 34 g/dL (ref 30.0–36.0)
MCV: 88.5 fL (ref 78.0–100.0)
Platelets: 237 10*3/uL (ref 150–400)
RBC: 5.22 MIL/uL (ref 4.22–5.81)
RDW: 13.5 % (ref 11.5–15.5)
WBC: 10.4 10*3/uL (ref 4.0–10.5)

## 2017-08-03 LAB — COMPREHENSIVE METABOLIC PANEL
ALK PHOS: 88 U/L (ref 38–126)
ALT: 62 U/L (ref 17–63)
ANION GAP: 8 (ref 5–15)
AST: 50 U/L — ABNORMAL HIGH (ref 15–41)
Albumin: 4.8 g/dL (ref 3.5–5.0)
BUN: 15 mg/dL (ref 6–20)
CALCIUM: 9.5 mg/dL (ref 8.9–10.3)
CO2: 26 mmol/L (ref 22–32)
Chloride: 100 mmol/L — ABNORMAL LOW (ref 101–111)
Creatinine, Ser: 0.99 mg/dL (ref 0.61–1.24)
Glucose, Bld: 115 mg/dL — ABNORMAL HIGH (ref 65–99)
Potassium: 4.8 mmol/L (ref 3.5–5.1)
SODIUM: 134 mmol/L — AB (ref 135–145)
TOTAL PROTEIN: 8.2 g/dL — AB (ref 6.5–8.1)
Total Bilirubin: 0.9 mg/dL (ref 0.3–1.2)

## 2017-08-03 LAB — URINALYSIS, MICROSCOPIC (REFLEX): WBC UA: NONE SEEN WBC/hpf (ref 0–5)

## 2017-08-03 LAB — URINALYSIS, ROUTINE W REFLEX MICROSCOPIC
Bilirubin Urine: NEGATIVE
Glucose, UA: NEGATIVE mg/dL
Ketones, ur: NEGATIVE mg/dL
LEUKOCYTES UA: NEGATIVE
NITRITE: NEGATIVE
PROTEIN: NEGATIVE mg/dL
SPECIFIC GRAVITY, URINE: 1.015 (ref 1.005–1.030)
pH: 8.5 — ABNORMAL HIGH (ref 5.0–8.0)

## 2017-08-03 LAB — LIPASE, BLOOD: LIPASE: 20 U/L (ref 11–51)

## 2017-08-03 MED ORDER — KETOROLAC TROMETHAMINE 30 MG/ML IJ SOLN
30.0000 mg | Freq: Once | INTRAMUSCULAR | Status: AC
  Administered 2017-08-03: 30 mg via INTRAVENOUS
  Filled 2017-08-03: qty 1

## 2017-08-03 MED ORDER — FENTANYL CITRATE (PF) 100 MCG/2ML IJ SOLN
50.0000 ug | Freq: Once | INTRAMUSCULAR | Status: AC
  Administered 2017-08-03: 50 ug via INTRAVENOUS
  Filled 2017-08-03: qty 2

## 2017-08-03 MED ORDER — SODIUM CHLORIDE 0.9 % IV BOLUS (SEPSIS)
1000.0000 mL | Freq: Once | INTRAVENOUS | Status: AC
  Administered 2017-08-03: 1000 mL via INTRAVENOUS

## 2017-08-03 MED ORDER — TAMSULOSIN HCL 0.4 MG PO CAPS: 0.4000 mg | ORAL_CAPSULE | Freq: Every day | ORAL | 0 refills | Status: DC

## 2017-08-03 MED ORDER — ONDANSETRON 4 MG PO TBDP: 4.0000 mg | ORAL_TABLET | Freq: Three times a day (TID) | ORAL | 0 refills | Status: DC | PRN

## 2017-08-03 MED ORDER — ONDANSETRON 4 MG PO TBDP
4.0000 mg | ORAL_TABLET | Freq: Once | ORAL | Status: AC
  Administered 2017-08-03: 4 mg via ORAL
  Filled 2017-08-03: qty 1

## 2017-08-03 MED ORDER — OXYCODONE-ACETAMINOPHEN 5-325 MG PO TABS: 1.0000 | ORAL_TABLET | Freq: Four times a day (QID) | ORAL | 0 refills | Status: DC | PRN

## 2017-08-03 MED ORDER — KETOROLAC TROMETHAMINE 10 MG PO TABS: 10.0000 mg | ORAL_TABLET | Freq: Four times a day (QID) | ORAL | 0 refills | Status: DC | PRN

## 2017-08-03 NOTE — ED Provider Notes (Signed)
[facility] EMERGENCY DEPARTMENT Provider Note   CSN: [id number] Arrival date & time: 08/03/17  1108     History   Chief Complaint Chief Complaint  Patient presents with  . Abdominal Pain    HPI Carlos Potts is a 51 y.o. male.  HPI   51 year old male presenting with left lower quadrant pain.  Patient reports it started last night.  He reports he had it for 2 days last week and it went away.  He reports that it is constant in nature.  He feels he can't get comfortable.  He has had noticed mildly loose stools.  Nausea no vomiting.  No fevers.  He feels like it is constant in nature.  No urinary symptoms. no blood in his urine.  No history of  diverticulosis diverticulitis or kidney stones in the past.  No abdominal surgeries.  Past Medical History:  Diagnosis Date  . Arthritis   . Back ache   . Seasonal allergies   . Tinnitus     Patient Active Problem List   Diagnosis Date Noted  . Chalazion left lower eyelid 07/27/2016  . Osteoarthritis of both hands 07/27/2016  . Abnormal thyroid blood test 04/27/2016  . Rhinitis, allergic 03/28/2016  . Bilateral low back pain without sciatica 03/28/2016  . Adult BMI > 30 03/28/2016    Past Surgical History:  Procedure Laterality Date  . TONSILLECTOMY         Home Medications    Prior to Admission medications   Medication Sig Start Date End Date Taking? Authorizing Provider  azithromycin (ZITHROMAX Z-PAK) 250 MG tablet Take 2 tabs today; then begin one tab once daily for 4 more days. (Rx void after 12/17/16) 12/09/16   Lattie HawBeese, Stephen A, MD  benzonatate (TESSALON) 200 MG capsule Take one cap by mouth at bedtime as needed for cough.  May repeat in 4 to 6 hours 12/09/16   Lattie HawBeese, Stephen A, MD  cyclobenzaprine (FLEXERIL) 5 MG tablet Take 1 tablet (5 mg total) by mouth 3 (three) times daily as needed for muscle spasms. 05/08/17   Sunnie NielsenAlexander, Natalie, DO  ketorolac (TORADOL) 10 MG tablet Take 1 tablet (10 mg total) by  mouth every 6 (six) hours as needed. 08/03/17   Meaghan Whistler Lyn, MD  ondansetron (ZOFRAN ODT) 4 MG disintegrating tablet Take 1 tablet (4 mg total) by mouth every 8 (eight) hours as needed for nausea or vomiting. 08/03/17   Adante Courington Lyn, MD  oxyCODONE-acetaminophen (PERCOCET/ROXICET) 5-325 MG tablet Take 1 tablet by mouth every 6 (six) hours as needed for severe pain. 08/03/17   Emmanuell Kantz Lyn, MD  tamsulosin (FLOMAX) 0.4 MG CAPS capsule Take 1 capsule (0.4 mg total) by mouth daily. 08/03/17   Telvin Reinders, Cindee Saltourteney Lyn, MD    Family History Family History  Problem Relation Age of Onset  . Cancer Father   . Cancer Sister     Social History Social History   Tobacco Use  . Smoking status: Never Smoker  . Smokeless tobacco: Never Used  Substance Use Topics  . Alcohol use: No  . Drug use: No     Allergies   Patient has no known allergies.   Review of Systems Review of Systems  Constitutional: Negative for activity change, fatigue and fever.  Respiratory: Negative for shortness of breath.   Cardiovascular: Negative for chest pain.  Gastrointestinal: Positive for abdominal pain. Negative for diarrhea, nausea and vomiting.  Genitourinary: Negative for decreased urine volume, difficulty urinating, dysuria, frequency, hematuria and  urgency.     Physical Exam Updated Vital Signs BP 130/89 (BP Location: Left Arm)   Pulse 93   Temp 98.3 F (36.8 C) (Oral)   Resp 18   Ht 5\' 8"  (1.727 m)   Wt 99.8 kg (220 lb)   SpO2 100%   BMI 33.45 kg/m   Physical Exam  Constitutional: He is oriented to person, place, and time. He appears well-nourished.  HENT:  Head: Normocephalic.  Eyes: Conjunctivae are normal.  Cardiovascular: Normal rate.  Pulmonary/Chest: Effort normal.  Abdominal: Normal appearance and bowel sounds are normal. There is no tenderness.  No abdominal pain with palpation.  Neurological: He is oriented to person, place, and time.  Skin: Skin is warm  and dry. He is not diaphoretic.  Psychiatric: He has a normal mood and affect. His behavior is normal.     ED Treatments / Results  Labs (all labs ordered are listed, but only abnormal results are displayed) Labs Reviewed  COMPREHENSIVE METABOLIC PANEL - Abnormal; Notable for the following components:      Result Value   Sodium 134 (*)    Chloride 100 (*)    Glucose, Bld 115 (*)    Total Protein 8.2 (*)    AST 50 (*)    All other components within normal limits  URINALYSIS, ROUTINE W REFLEX MICROSCOPIC - Abnormal; Notable for the following components:   pH 8.5 (*)    Hgb urine dipstick TRACE (*)    All other components within normal limits  URINALYSIS, MICROSCOPIC (REFLEX) - Abnormal; Notable for the following components:   Bacteria, UA RARE (*)    Squamous Epithelial / LPF 0-5 (*)    All other components within normal limits  LIPASE, BLOOD  CBC    EKG  EKG Interpretation None       Radiology Ct Renal Stone Study  Result Date: 08/03/2017 CLINICAL DATA:  51 year old male with intermittent left lower quadrant and left flank pain for several days. Blood in urine on UA. EXAM: CT ABDOMEN AND PELVIS WITHOUT CONTRAST TECHNIQUE: Multidetector CT imaging of the abdomen and pelvis was performed following the standard protocol without IV contrast. COMPARISON:  None. FINDINGS: Lower chest: No acute abnormality. Hepatobiliary: Hepatic parenchyma is diffusely hypodense consistent with steatosis there is focal fatty sparing at the gallbladder fossa. No focal liver abnormality is seen. No gallstones, gallbladder wall thickening, or biliary dilatation. Pancreas: Unremarkable. No pancreatic ductal dilatation or surrounding inflammatory changes. Spleen: Normal in size without focal abnormality. Adrenals/Urinary Tract: Adrenal glands are unremarkable. A 6 mm stone is noted at the left lower pole. A 1.5 x 1.7 cm hypodensity at the lower pole demonstrates fluid attenuation. This may represent a  parapelvic cyst or a focal area of early hydronephrosis from the adjacent stone, particularly as seen on the coronal view. The bilateral kidneys are otherwise unremarkable, without evidence for focal lesion, hydronephrosis or nephroureterolithiasis. Bladder is unremarkable. Stomach/Bowel: Stomach is within normal limits. Appendix appears normal. No evidence of bowel wall thickening, distention, or inflammatory changes. Vascular/Lymphatic: No significant vascular findings are present. No enlarged abdominal or pelvic lymph nodes. Reproductive: Prostate is unremarkable. Other: No abdominal wall hernia or abnormality. No abdominopelvic ascites. Musculoskeletal: No acute or significant osseous findings. IMPRESSION: 1. No definite evidence for obstructive nephroureterolithiasis. A 6 mm stone is noted at the left lower pole (PACs slice 49 of 106). An adjacent, fluid attenuating hypodensity may represent a parapelvic cyst or focal area of early hydronephrosis from the stone. 2. Hepatic steatosis  with focal sparing at the gallbladder fossa. 3. Otherwise unremarkable examination. Electronically Signed   By: Sande BrothersSerena  Chacko M.D.   On: 08/03/2017 13:13    Procedures Procedures (including critical care time)  Medications Ordered in ED Medications  fentaNYL (SUBLIMAZE) injection 50 mcg (50 mcg Intravenous Given 08/03/17 1223)  ondansetron (ZOFRAN-ODT) disintegrating tablet 4 mg (4 mg Oral Given 08/03/17 1223)  fentaNYL (SUBLIMAZE) injection 50 mcg (50 mcg Intravenous Given 08/03/17 1330)  ketorolac (TORADOL) 30 MG/ML injection 30 mg (30 mg Intravenous Given 08/03/17 1450)  sodium chloride 0.9 % bolus 1,000 mL (0 mLs Intravenous Stopped 08/03/17 1533)     Initial Impression / Assessment and Plan / ED Course  I have reviewed the triage vital signs and the nursing notes.  Pertinent labs & imaging results that were available during my care of the patient were reviewed by me and considered in my medical decision making  (see chart for details).    51 year old male presenting with left lower quadrant pain.  Patient reports it started last night.  He reports he had it for 2 days last week and it went away.  He reports that it is constant in nature.  He feels he can't get comfortable.  He has had noticed mildly loose stools.  Nausea no vomiting.  No fevers.  He feels like it is constant in nature.  No urinary symptoms. no blood in his urine.  No history of  diverticulosis diverticulitis or kidney stones in the past.  No abdominal surgeries.   8:54 AM There are components of patient's history that make me think of both diverticulitis versus kidney stone.  However given the fact that he was wandering around the room in pain, this  is more consistent with a stone.  Will start with CT stone.  Ct stone shows stone in left, where his pain is.  Discussed return precautions such as fever, increasing pain or other concerns.  [facility]. Gave flomax, zofran, and pain medications.   Final Clinical Impressions(s) / ED Diagnoses   Final diagnoses:  Kidney stone    ED Discharge Orders        Ordered    oxyCODONE-acetaminophen (PERCOCET/ROXICET) 5-325 MG tablet  Every 6 hours PRN     08/03/17 1550    ketorolac (TORADOL) 10 MG tablet  Every 6 hours PRN     08/03/17 1550    tamsulosin (FLOMAX) 0.4 MG CAPS capsule  Daily     08/03/17 1550    ondansetron (ZOFRAN ODT) 4 MG disintegrating tablet  Every 8 hours PRN     08/03/17 1550       Estreya Clay, Cindee Saltourteney Lyn, MD 08/04/17 [phone]

## 2017-08-03 NOTE — Discharge Instructions (Signed)
Found to have a 6 mm stone on the left side.  Please follow-up with urology this week.  As he stated if you have any fever, return immediately.  If you have any change or concerning symptoms please return.

## 2017-08-03 NOTE — ED Triage Notes (Signed)
LLQ abd pain since 6 this morning with nausea. Denies vomiting or diarrhea.

## 2017-08-03 NOTE — ED Notes (Signed)
ED Provider at bedside. 

## 2017-08-06 ENCOUNTER — Emergency Department (HOSPITAL_BASED_OUTPATIENT_CLINIC_OR_DEPARTMENT_OTHER)
Admission: EM | Admit: 2017-08-06 | Discharge: 2017-08-07 | Disposition: A | Discharge status: Home / Self Care | Payer: PRIVATE HEALTH INSURANCE | Attending: Emergency Medicine | Admitting: Emergency Medicine

## 2017-08-06 ENCOUNTER — Emergency Department (HOSPITAL_BASED_OUTPATIENT_CLINIC_OR_DEPARTMENT_OTHER): Payer: PRIVATE HEALTH INSURANCE

## 2017-08-06 ENCOUNTER — Encounter (HOSPITAL_BASED_OUTPATIENT_CLINIC_OR_DEPARTMENT_OTHER): Payer: Self-pay

## 2017-08-06 ENCOUNTER — Other Ambulatory Visit: Payer: Self-pay

## 2017-08-06 DIAGNOSIS — R109 Unspecified abdominal pain: Secondary | ICD-10-CM | POA: Diagnosis present

## 2017-08-06 DIAGNOSIS — N200 Calculus of kidney: Secondary | ICD-10-CM | POA: Diagnosis not present

## 2017-08-06 MED ORDER — HYDROMORPHONE HCL 1 MG/ML IJ SOLN
1.0000 mg | Freq: Once | INTRAMUSCULAR | Status: AC
  Administered 2017-08-06: 1 mg via INTRAVENOUS
  Filled 2017-08-06: qty 1

## 2017-08-06 MED ORDER — SODIUM CHLORIDE 0.9 % IV BOLUS (SEPSIS)
1000.0000 mL | Freq: Once | INTRAVENOUS | Status: AC
  Administered 2017-08-06: 1000 mL via INTRAVENOUS

## 2017-08-06 MED ORDER — ONDANSETRON HCL 4 MG/2ML IJ SOLN
4.0000 mg | Freq: Once | INTRAMUSCULAR | Status: AC
  Administered 2017-08-06: 4 mg via INTRAVENOUS
  Filled 2017-08-06: qty 2

## 2017-08-06 NOTE — ED Triage Notes (Signed)
C/o LLQ pain x today-was dx with kidney stone 12/8-was pain free 12/9-12/10-NAD-steady gait

## 2017-08-06 NOTE — ED Notes (Signed)
Pt has been taking his Flomax as prescribed, has not needed pain medication until this morning around approx 1030. Pt last dose of Toradol at 1830, last dose of oxycodone 1930. Pt reports one episode of dry heaves earlier today, with intermittant left sided abdominal pain.

## 2017-08-07 ENCOUNTER — Other Ambulatory Visit: Payer: Self-pay | Admitting: Urology

## 2017-08-07 LAB — URINALYSIS, MICROSCOPIC (REFLEX): WBC UA: NONE SEEN WBC/hpf (ref 0–5)

## 2017-08-07 LAB — CBC WITH DIFFERENTIAL/PLATELET
BASOS ABS: 0 10*3/uL (ref 0.0–0.1)
BASOS PCT: 0 %
Eosinophils Absolute: 0.1 10*3/uL (ref 0.0–0.7)
Eosinophils Relative: 1 %
HEMATOCRIT: 41.8 % (ref 39.0–52.0)
HEMOGLOBIN: 13.9 g/dL (ref 13.0–17.0)
Lymphocytes Relative: 24 %
Lymphs Abs: 2.3 10*3/uL (ref 0.7–4.0)
MCH: 29.8 pg (ref 26.0–34.0)
MCHC: 33.3 g/dL (ref 30.0–36.0)
MCV: 89.7 fL (ref 78.0–100.0)
Monocytes Absolute: 0.7 10*3/uL (ref 0.1–1.0)
Monocytes Relative: 8 %
NEUTROS ABS: 6.5 10*3/uL (ref 1.7–7.7)
NEUTROS PCT: 67 %
Platelets: 215 10*3/uL (ref 150–400)
RBC: 4.66 MIL/uL (ref 4.22–5.81)
RDW: 13.6 % (ref 11.5–15.5)
WBC: 9.6 10*3/uL (ref 4.0–10.5)

## 2017-08-07 LAB — URINALYSIS, ROUTINE W REFLEX MICROSCOPIC
Bilirubin Urine: NEGATIVE
Glucose, UA: NEGATIVE mg/dL
KETONES UR: NEGATIVE mg/dL
LEUKOCYTES UA: NEGATIVE
NITRITE: NEGATIVE
PH: 6 (ref 5.0–8.0)
Protein, ur: NEGATIVE mg/dL

## 2017-08-07 LAB — BASIC METABOLIC PANEL
ANION GAP: 7 (ref 5–15)
BUN: 16 mg/dL (ref 6–20)
CALCIUM: 9.1 mg/dL (ref 8.9–10.3)
CHLORIDE: 102 mmol/L (ref 101–111)
CO2: 26 mmol/L (ref 22–32)
Creatinine, Ser: 0.91 mg/dL (ref 0.61–1.24)
GFR calc non Af Amer: >60 mL/min (ref >60)
Glucose, Bld: 112 mg/dL — ABNORMAL HIGH (ref 65–99)
Potassium: 3.9 mmol/L (ref 3.5–5.1)
SODIUM: 135 mmol/L (ref 135–145)

## 2017-08-07 MED ORDER — OXYCODONE-ACETAMINOPHEN 5-325 MG PO TABS: 1.0000 | ORAL_TABLET | ORAL | 0 refills | Status: DC | PRN

## 2017-08-07 MED ORDER — KETOROLAC TROMETHAMINE 30 MG/ML IJ SOLN
30.0000 mg | Freq: Once | INTRAMUSCULAR | Status: AC
  Administered 2017-08-07: 30 mg via INTRAVENOUS
  Filled 2017-08-07: qty 1

## 2017-08-07 MED ORDER — OXYCODONE-ACETAMINOPHEN 5-325 MG PO TABS
1.0000 | ORAL_TABLET | Freq: Once | ORAL | Status: AC
  Administered 2017-08-07: 1 via ORAL
  Filled 2017-08-07: qty 1

## 2017-08-07 NOTE — ED Notes (Signed)
ED Provider at bedside. 

## 2017-08-07 NOTE — Discharge Instructions (Signed)
You were seen today for left flank pain.  This is likely related to an obstructing kidney stone on the left.  Call urology for follow-up given your recurrent pain.  Take scheduled Toradol every 6 hours.  Take Percocet as needed for breakthrough pain.  If you develop fevers or any new or worsening symptoms you should be reevaluated.

## 2017-08-07 NOTE — ED Provider Notes (Signed)
[facility] EMERGENCY DEPARTMENT Provider Note   CSN: [id number] Arrival date & time: 08/06/17  2008     History   Chief Complaint Chief Complaint  Patient presents with  . Abdominal Pain    HPI Carlos Potts is a 51 y.o. male.  HPI  This is a 51 year old male who presents with left flank and abdominal pain.  Patient was diagnosed with kidney stones on Saturday.  He at that time was noted to have a 6 mm stone in the lower pole left kidney.  There was some evidence of obstruction but no obvious obstructing stone during that CT scan.  He was treated presumptively as a stone.  He reports he had resolution of symptoms for 2 days but had recurrence of symptoms this morning.  He has taken multiple doses of Toradol and Percocet with no relief of pain.  Currently his pain is 7 out of 10.  Has hematuria, nausea, vomiting, diarrhea.  Denies fevers.  Past Medical History:  Diagnosis Date  . Arthritis   . Back ache   . Kidney stone   . Seasonal allergies   . Tinnitus     Patient Active Problem List   Diagnosis Date Noted  . Chalazion left lower eyelid 07/27/2016  . Osteoarthritis of both hands 07/27/2016  . Abnormal thyroid blood test 04/27/2016  . Rhinitis, allergic 03/28/2016  . Bilateral low back pain without sciatica 03/28/2016  . Adult BMI > 30 03/28/2016    Past Surgical History:  Procedure Laterality Date  . TONSILLECTOMY         Home Medications    Prior to Admission medications   Medication Sig Start Date End Date Taking? Authorizing Provider  azithromycin (ZITHROMAX Z-PAK) 250 MG tablet Take 2 tabs today; then begin one tab once daily for 4 more days. (Rx void after 12/17/16) 12/09/16   Lattie HawBeese, Stephen A, MD  benzonatate (TESSALON) 200 MG capsule Take one cap by mouth at bedtime as needed for cough.  May repeat in 4 to 6 hours 12/09/16   Lattie HawBeese, Stephen A, MD  cyclobenzaprine (FLEXERIL) 5 MG tablet Take 1 tablet (5 mg total) by mouth 3 (three) times  daily as needed for muscle spasms. 05/08/17   Sunnie NielsenAlexander, Natalie, DO  ketorolac (TORADOL) 10 MG tablet Take 1 tablet (10 mg total) by mouth every 6 (six) hours as needed. 08/03/17   Mackuen, Courteney Lyn, MD  ondansetron (ZOFRAN ODT) 4 MG disintegrating tablet Take 1 tablet (4 mg total) by mouth every 8 (eight) hours as needed for nausea or vomiting. 08/03/17   Mackuen, Courteney Lyn, MD  oxyCODONE-acetaminophen (PERCOCET/ROXICET) 5-325 MG tablet Take 1-2 tablets by mouth every 4 (four) hours as needed for severe pain. 08/07/17   Horton, Mayer Maskerourtney F, MD  tamsulosin (FLOMAX) 0.4 MG CAPS capsule Take 1 capsule (0.4 mg total) by mouth daily. 08/03/17   Mackuen, Cindee Saltourteney Lyn, MD    Family History Family History  Problem Relation Age of Onset  . Cancer Father   . Cancer Sister     Social History Social History   Tobacco Use  . Smoking status: Never Smoker  . Smokeless tobacco: Never Used  Substance Use Topics  . Alcohol use: No  . Drug use: No     Allergies   Patient has no known allergies.   Review of Systems Review of Systems  Constitutional: Negative for fever.  Respiratory: Negative for shortness of breath.   Cardiovascular: Negative for chest pain.  Gastrointestinal: Positive for abdominal  pain. Negative for diarrhea and nausea.  Genitourinary: Positive for flank pain. Negative for hematuria.  All other systems reviewed and are negative.    Physical Exam Updated Vital Signs BP 128/88   Pulse 80   Temp 98.2 F (36.8 C) (Oral)   Resp 17   Ht 5\' 8"  (1.727 m)   Wt 101.6 kg (224 lb)   SpO2 96%   BMI 34.06 kg/m   Physical Exam  Constitutional: He is oriented to person, place, and time. He appears well-developed and well-nourished.  HENT:  Head: Normocephalic and atraumatic.  Neck: Neck supple.  Cardiovascular: Normal rate, regular rhythm and normal heart sounds.  No murmur heard. Pulmonary/Chest: Effort normal and breath sounds normal. No respiratory distress. He  has no wheezes.  Abdominal: Soft. Bowel sounds are normal. There is no tenderness. There is no rebound and no guarding.  Genitourinary:  Genitourinary Comments: No CVA tenderness  Musculoskeletal: He exhibits no edema.  Lymphadenopathy:    He has no cervical adenopathy.  Neurological: He is alert and oriented to person, place, and time.  Skin: Skin is warm and dry.  Psychiatric: He has a normal mood and affect.  Nursing note and vitals reviewed.    ED Treatments / Results  Labs (all labs ordered are listed, but only abnormal results are displayed) Labs Reviewed  BASIC METABOLIC PANEL - Abnormal; Notable for the following components:      Result Value   Glucose, Bld 112 (*)    All other components within normal limits  URINALYSIS, ROUTINE W REFLEX MICROSCOPIC - Abnormal; Notable for the following components:   Specific Gravity, Urine <1.005 (*)    Hgb urine dipstick MODERATE (*)    All other components within normal limits  URINALYSIS, MICROSCOPIC (REFLEX) - Abnormal; Notable for the following components:   Bacteria, UA RARE (*)    Squamous Epithelial / LPF 0-5 (*)    All other components within normal limits  CBC WITH DIFFERENTIAL/PLATELET    EKG  EKG Interpretation None       Radiology Dg Abdomen 1 View  Result Date: 08/06/2017 CLINICAL DATA:  Left lower quadrant pain, history of renal stones. EXAM: ABDOMEN - 1 VIEW COMPARISON:  CT from 08/03/2017 FINDINGS: The bowel gas pattern is normal. An 8 mm calculus is seen just caudal to the L3 left transverse process suspicious for a proximal to mid left ureteral stone. This may represent the previously noted left renal stone now dislodged into the left ureter. Bowel gas pattern is unremarkable. IMPRESSION: 8 mm calculus the left hemiabdomen is suspicious for a proximal to mid left ureteral stone. Electronically Signed   By: Tollie Ethavid  Kwon M.D.   On: 08/06/2017 23:59    Procedures Procedures (including critical care  time)  Medications Ordered in ED Medications  sodium chloride 0.9 % bolus 1,000 mL (0 mLs Intravenous Stopped 08/07/17 0044)  HYDROmorphone (DILAUDID) injection 1 mg (1 mg Intravenous Given 08/06/17 2349)  ondansetron (ZOFRAN) injection 4 mg (4 mg Intravenous Given 08/06/17 2348)  ketorolac (TORADOL) 30 MG/ML injection 30 mg (30 mg Intravenous Given 08/07/17 0048)  oxyCODONE-acetaminophen (PERCOCET/ROXICET) 5-325 MG per tablet 1 tablet (1 tablet Oral Given 08/07/17 0048)     Initial Impression / Assessment and Plan / ED Course  I have reviewed the triage vital signs and the nursing notes.  Pertinent labs & imaging results that were available during my care of the patient were reviewed by me and considered in my medical decision making (see chart for  details).     Patient presents with recurrent left flank and abdominal pain.  Nontoxic on exam.  Afebrile.  Vital signs reassuring.  I reviewed his CT scan.  They noted a 6 mm stone in the lower pole of the left kidney with an adjacent fluid collection which may represent early hydro-versus perinephric cyst.  No other obstructing stone noted.  Lab work was reinitiated.  Patient was given fluids, pain, nausea medication.  KUB shows what appears to be an 8 mm stone in the proximal to mid left ureteral stone.  I suspect this is the same 6 mm stone that was noted on CT (size difference related to projection).  Lab work is largely reassuring.  Patient was redosed with Toradol.  Given recurrence of symptoms, will touch base with urology regarding urgent clinic follow-up.  Patient may need a stent.  On recheck, pain level is 2 out of 10.  1:34 AM Urology not returning page.  Patient remains comfortable.  They would like to go home.  Encourage close follow-up with urology.  Patient stated understanding.  Recommend scheduled Toradol with Percocet for breakthrough.  Will increase sequencing to every 4 hours as needed.  Continue Flomax.  After history, exam,  and medical workup I feel the patient has been appropriately medically screened and is safe for discharge home. Pertinent diagnoses were discussed with the patient. Patient was given return precautions.   Final Clinical Impressions(s) / ED Diagnoses   Final diagnoses:  Kidney stone    ED Discharge Orders        Ordered    oxyCODONE-acetaminophen (PERCOCET/ROXICET) 5-325 MG tablet  Every 4 hours PRN     08/07/17 0133       Shon Baton, MD 08/07/17 [phone]

## 2017-08-08 ENCOUNTER — Encounter (HOSPITAL_COMMUNITY): Payer: Self-pay | Admitting: *Deleted

## 2017-08-10 ENCOUNTER — Encounter (HOSPITAL_BASED_OUTPATIENT_CLINIC_OR_DEPARTMENT_OTHER): Payer: Self-pay | Admitting: Emergency Medicine

## 2017-08-10 ENCOUNTER — Other Ambulatory Visit: Payer: Self-pay

## 2017-08-10 ENCOUNTER — Emergency Department (HOSPITAL_BASED_OUTPATIENT_CLINIC_OR_DEPARTMENT_OTHER)
Admission: EM | Admit: 2017-08-10 | Discharge: 2017-08-10 | Disposition: A | Discharge status: Home / Self Care | Payer: PRIVATE HEALTH INSURANCE | Attending: Emergency Medicine | Admitting: Emergency Medicine

## 2017-08-10 ENCOUNTER — Emergency Department (HOSPITAL_BASED_OUTPATIENT_CLINIC_OR_DEPARTMENT_OTHER): Payer: PRIVATE HEALTH INSURANCE

## 2017-08-10 DIAGNOSIS — T402X5A Adverse effect of other opioids, initial encounter: Secondary | ICD-10-CM | POA: Insufficient documentation

## 2017-08-10 DIAGNOSIS — N23 Unspecified renal colic: Secondary | ICD-10-CM | POA: Diagnosis not present

## 2017-08-10 DIAGNOSIS — K5903 Drug induced constipation: Secondary | ICD-10-CM | POA: Insufficient documentation

## 2017-08-10 MED ORDER — MAGNESIUM CITRATE PO SOLN: ORAL | Status: DC

## 2017-08-10 MED ORDER — MAGNESIUM CITRATE PO SOLN
1.0000 | Freq: Once | ORAL | Status: AC
  Administered 2017-08-10: 1 via ORAL
  Filled 2017-08-10: qty 296

## 2017-08-10 NOTE — ED Provider Notes (Signed)
[facility] Provider Note: Carlos Potts Ahamed Hofland, MD, FACEP  CSN: [id number] MRN: [id number] ARRIVAL: 08/10/17 at 0052 ROOM: [facility]   CHIEF COMPLAINT  Abdominal Pain   HISTORY OF PRESENT ILLNESS  08/10/17 3:51 AM Carlos Potts is a 51 y.o. male who was diagnosed with a kidney stone about a week ago.  He was started on oxycodone.  He has been taking oxycodone off and on this past week.  The pain of the kidney stone is primarily in the left abdomen and he rates it as an 8 out of 10.  It is been partly relieved by the oxycodone.  Since taking the oxycodone however he has developed a more generalized crampy abdominal pain with abdominal bloating.  He has had decreased bowel movements as well.  He did take a dose of Dulcolax 2 tablets yesterday but has not had a bowel movement.  He is scheduled for lithotripsy the day after tomorrow.  Past Medical History:  Diagnosis Date  . Arthritis   . Back ache   . Kidney stone   . Seasonal allergies   . Tinnitus     Past Surgical History:  Procedure Laterality Date  . TONSILLECTOMY      Family History  Problem Relation Age of Onset  . Cancer Father   . Cancer Sister     Social History   Tobacco Use  . Smoking status: Never Smoker  . Smokeless tobacco: Never Used  Substance Use Topics  . Alcohol use: No  . Drug use: No    Prior to Admission medications   Medication Sig Start Date End Date Taking? Authorizing Provider  azithromycin (ZITHROMAX Z-PAK) 250 MG tablet Take 2 tabs today; then begin one tab once daily for 4 more days. (Rx void after 12/17/16) 12/09/16   Lattie HawBeese, Stephen A, MD  benzonatate (TESSALON) 200 MG capsule Take one cap by mouth at bedtime as needed for cough.  May repeat in 4 to 6 hours 12/09/16   Lattie HawBeese, Stephen A, MD  cyclobenzaprine (FLEXERIL) 5 MG tablet Take 1 tablet (5 mg total) by mouth 3 (three) times daily as needed for muscle spasms. 05/08/17   Sunnie NielsenAlexander, Natalie, DO  ibuprofen (ADVIL,MOTRIN) 200 MG  tablet Take 200 mg by mouth every 6 (six) hours as needed.    [provider]  ketorolac (TORADOL) 10 MG tablet Take 1 tablet (10 mg total) by mouth every 6 (six) hours as needed. 08/03/17   Mackuen, Courteney Lyn, MD  ondansetron (ZOFRAN ODT) 4 MG disintegrating tablet Take 1 tablet (4 mg total) by mouth every 8 (eight) hours as needed for nausea or vomiting. 08/03/17   Mackuen, Courteney Lyn, MD  oxyCODONE-acetaminophen (PERCOCET/ROXICET) 5-325 MG tablet Take 1-2 tablets by mouth every 4 (four) hours as needed for severe pain. 08/07/17   Horton, Mayer Maskerourtney F, MD  tamsulosin (FLOMAX) 0.4 MG CAPS capsule Take 1 capsule (0.4 mg total) by mouth daily. 08/03/17   Mackuen, Cindee Saltourteney Lyn, MD    Allergies Patient has no known allergies.   REVIEW OF SYSTEMS  Negative except as noted here or in the History of Present Illness.   PHYSICAL EXAMINATION  Initial Vital Signs Blood pressure (!) 166/94, pulse 86, temperature 97.6 F (36.4 C), temperature source Oral, resp. rate 16, SpO2 96 %.  Examination General: Well-developed, well-nourished male in no acute distress; appearance consistent with age of record HENT: normocephalic; atraumatic Eyes: pupils equal, round and reactive to light; extraocular muscles intact Neck: supple Heart: regular rate and rhythm  Lungs: clear to auscultation bilaterally Abdomen: soft; distended; mild diffuse tenderness; bowel sounds present Extremities: No deformity; full range of motion; pulses normal Neurologic: Awake, alert and oriented; motor function intact in all extremities and symmetric; no facial droop Skin: Warm and dry Psychiatric: Normal mood and affect   RESULTS  Summary of this visit's results, reviewed by myself:   EKG Interpretation  Date/Time:    Ventricular Rate:    PR Interval:    QRS Duration:   QT Interval:    QTC Calculation:   R Axis:     Text Interpretation:        Laboratory Studies: No results found for this or any  previous visit (from the past 24 hour(s)). Imaging Studies: Dg Abdomen 1 View  Result Date: 08/10/2017 CLINICAL DATA:  Acute onset of generalized abdominal pain and constipation. EXAM: ABDOMEN - 1 VIEW COMPARISON:  Abdominal radiograph performed 08/06/2017 FINDINGS: The visualized bowel gas pattern is unremarkable. Scattered air and stool filled loops of colon are seen; no abnormal dilatation of small bowel loops is seen to suggest small bowel obstruction. No free intra-abdominal air is identified, though evaluation for free air is limited on a single supine view. The visualized osseous structures are within normal limits; the sacroiliac joints are unremarkable in appearance. IMPRESSION: Unremarkable bowel gas pattern; no free intra-abdominal air seen. Small to moderate amount of stool noted in the colon. Electronically Signed   By: Roanna RaiderJeffery  Chang M.D.   On: 08/10/2017 03:30    ED COURSE  Nursing notes and initial vitals signs, including pulse oximetry, reviewed.  Vitals:   08/10/17 0100  BP: (!) 166/94  Pulse: 86  Resp: 16  Temp: 97.6 F (36.4 C)  TempSrc: Oral  SpO2: 96%   We will start the patient on magnesium citrate for the next 2 days pending his lithotripsy.  PROCEDURES    ED DIAGNOSES     ICD-10-CM   1. Constipation due to opioid therapy K59.03    T40.2X5A   2. Ureteral colic N23        Dellie Piasecki, MD 08/10/17 [id number]

## 2017-08-10 NOTE — ED Triage Notes (Addendum)
PT presents with c/o abdominal pain for a week. PT was seen here for kidney stone a week ago.  PT states he has not had a BM in 2 days . And that he is scheduled for lithotripsy Monday.

## 2017-08-11 ENCOUNTER — Emergency Department (HOSPITAL_COMMUNITY)
Admission: EM | Admit: 2017-08-11 | Discharge: 2017-08-12 | Disposition: A | Discharge status: Home / Self Care | Payer: PRIVATE HEALTH INSURANCE | Source: Home / Self Care | Attending: Emergency Medicine | Admitting: Emergency Medicine

## 2017-08-11 ENCOUNTER — Encounter (HOSPITAL_COMMUNITY): Payer: Self-pay | Admitting: Emergency Medicine

## 2017-08-11 DIAGNOSIS — R1084 Generalized abdominal pain: Secondary | ICD-10-CM

## 2017-08-11 DIAGNOSIS — K310 Acute dilatation of stomach: Secondary | ICD-10-CM | POA: Insufficient documentation

## 2017-08-11 DIAGNOSIS — Z87442 Personal history of urinary calculi: Secondary | ICD-10-CM | POA: Insufficient documentation

## 2017-08-11 DIAGNOSIS — N23 Unspecified renal colic: Secondary | ICD-10-CM | POA: Insufficient documentation

## 2017-08-11 DIAGNOSIS — K5900 Constipation, unspecified: Secondary | ICD-10-CM | POA: Insufficient documentation

## 2017-08-11 DIAGNOSIS — Z79899 Other long term (current) drug therapy: Secondary | ICD-10-CM

## 2017-08-11 DIAGNOSIS — M1990 Unspecified osteoarthritis, unspecified site: Secondary | ICD-10-CM | POA: Diagnosis not present

## 2017-08-11 DIAGNOSIS — N132 Hydronephrosis with renal and ureteral calculous obstruction: Secondary | ICD-10-CM | POA: Diagnosis present

## 2017-08-11 MED ORDER — SODIUM CHLORIDE 0.9 % IV BOLUS (SEPSIS)
1000.0000 mL | Freq: Once | INTRAVENOUS | Status: AC
  Administered 2017-08-12: 1000 mL via INTRAVENOUS

## 2017-08-11 MED ORDER — ONDANSETRON HCL 4 MG/2ML IJ SOLN
4.0000 mg | Freq: Once | INTRAMUSCULAR | Status: AC
  Administered 2017-08-12: 4 mg via INTRAVENOUS
  Filled 2017-08-11: qty 2

## 2017-08-11 MED ORDER — MORPHINE SULFATE (PF) 4 MG/ML IV SOLN
4.0000 mg | Freq: Once | INTRAVENOUS | Status: AC
  Administered 2017-08-12: 4 mg via INTRAVENOUS
  Filled 2017-08-11: qty 1

## 2017-08-11 NOTE — ED Triage Notes (Signed)
Pt from home with c/o abdominal pain, bloating, and flank pain. Pt was diagnosed with kidney stones and is scheduled for lithotripsy tomorrow morning. He was put on narcotics for his stone which caused him to have constipation. Pt reports he was taking mag citrate at home since his last visit for constipation. Pt states he had a bm last night, but has not had one today and he feels as if his bloating has returned.

## 2017-08-11 NOTE — ED Provider Notes (Signed)
[facility]-EMERGENCY DEPT Provider Note   CSN: [id number] Arrival date & time: 08/11/17  2217     History   Chief Complaint Chief Complaint  Patient presents with  . Constipation  . Abdominal Pain    HPI Carlos Potts is a 51 y.o. male.  Patient is a 51 year old male with recent diagnosis of renal calculus due for a lithotripsy tomorrow.  He presents today with complaints of abdominal distention, cramping, vomiting, and feeling as though he is constipated.  He was seen yesterday with similar symptoms.  He was given magnesium citrate which does not seem to be helping.  He denies any fevers or chills.  He denies any burning with urination or blood in his urine.   The history is provided by the patient.  Constipation   This is a new problem. The current episode started 2 days ago. Treatments tried: Magnesium citrate. The treatment provided no relief.    Past Medical History:  Diagnosis Date  . Arthritis   . Back ache   . Kidney stone   . Seasonal allergies   . Tinnitus     Patient Active Problem List   Diagnosis Date Noted  . Chalazion left lower eyelid 07/27/2016  . Osteoarthritis of both hands 07/27/2016  . Abnormal thyroid blood test 04/27/2016  . Rhinitis, allergic 03/28/2016  . Bilateral low back pain without sciatica 03/28/2016  . Adult BMI > 30 03/28/2016    Past Surgical History:  Procedure Laterality Date  . TONSILLECTOMY         Home Medications    Prior to Admission medications   Medication Sig Start Date End Date Taking? Authorizing Provider  benzonatate (TESSALON) 200 MG capsule Take one cap by mouth at bedtime as needed for cough.  May repeat in 4 to 6 hours 12/09/16   Lattie HawBeese, Stephen A, MD  cyclobenzaprine (FLEXERIL) 5 MG tablet Take 1 tablet (5 mg total) by mouth 3 (three) times daily as needed for muscle spasms. 05/08/17   Sunnie NielsenAlexander, Natalie, DO  magnesium citrate SOLN Take 1 bottle daily as needed for constipation.  08/10/17   Molpus, John, MD  ondansetron (ZOFRAN ODT) 4 MG disintegrating tablet Take 1 tablet (4 mg total) by mouth every 8 (eight) hours as needed for nausea or vomiting. 08/03/17   Mackuen, Courteney Lyn, MD  oxyCODONE-acetaminophen (PERCOCET/ROXICET) 5-325 MG tablet Take 1-2 tablets by mouth every 4 (four) hours as needed for severe pain. 08/07/17   Horton, Mayer Maskerourtney F, MD  tamsulosin (FLOMAX) 0.4 MG CAPS capsule Take 1 capsule (0.4 mg total) by mouth daily. 08/03/17   Mackuen, Cindee Saltourteney Lyn, MD    Family History Family History  Problem Relation Age of Onset  . Cancer Father   . Cancer Sister     Social History Social History   Tobacco Use  . Smoking status: Never Smoker  . Smokeless tobacco: Never Used  Substance Use Topics  . Alcohol use: No  . Drug use: No     Allergies   Patient has no known allergies.   Review of Systems Review of Systems  All other systems reviewed and are negative.    Physical Exam Updated Vital Signs BP (!) 156/109 (BP Location: Left Arm)   Temp 98.9 F (37.2 C) (Oral)   Resp 20   SpO2 93%   Physical Exam  Constitutional: He is oriented to person, place, and time. He appears well-developed and well-nourished. No distress.  HENT:  Head: Normocephalic and atraumatic.  Mouth/Throat: Oropharynx  is clear and moist.  Neck: Normal range of motion. Neck supple.  Cardiovascular: Normal rate and regular rhythm. Exam reveals no friction rub.  No murmur heard. Pulmonary/Chest: Effort normal and breath sounds normal. No respiratory distress. He has no wheezes. He has no rales.  Abdominal: Soft. Bowel sounds are normal. He exhibits distension. He exhibits no fluid wave. There is generalized tenderness. There is no rigidity, no rebound and no guarding.  Abdomen is somewhat distended and tympanitic.  There is generalized tenderness with no peritoneal signs.  Musculoskeletal: Normal range of motion. He exhibits no edema.  Neurological: He is alert and  oriented to person, place, and time. Coordination normal.  Skin: Skin is warm and dry. He is not diaphoretic.  Nursing note and vitals reviewed.    ED Treatments / Results  Labs (all labs ordered are listed, but only abnormal results are displayed) Labs Reviewed  COMPREHENSIVE METABOLIC PANEL  CBC WITH DIFFERENTIAL/PLATELET  LIPASE, BLOOD  URINALYSIS, ROUTINE W REFLEX MICROSCOPIC    EKG  EKG Interpretation None       Radiology Dg Abdomen 1 View  Result Date: 08/10/2017 CLINICAL DATA:  Acute onset of generalized abdominal pain and constipation. EXAM: ABDOMEN - 1 VIEW COMPARISON:  Abdominal radiograph performed 08/06/2017 FINDINGS: The visualized bowel gas pattern is unremarkable. Scattered air and stool filled loops of colon are seen; no abnormal dilatation of small bowel loops is seen to suggest small bowel obstruction. No free intra-abdominal air is identified, though evaluation for free air is limited on a single supine view. The visualized osseous structures are within normal limits; the sacroiliac joints are unremarkable in appearance. IMPRESSION: Unremarkable bowel gas pattern; no free intra-abdominal air seen. Small to moderate amount of stool noted in the colon. Electronically Signed   By: Roanna RaiderJeffery  Chang M.D.   On: 08/10/2017 03:30    Procedures Procedures (including critical care time)  Medications Ordered in ED Medications  sodium chloride 0.9 % bolus 1,000 mL (not administered)  ondansetron (ZOFRAN) injection 4 mg (not administered)  morphine 4 MG/ML injection 4 mg (not administered)     Initial Impression / Assessment and Plan / ED Course  I have reviewed the triage vital signs and the nursing notes.  Pertinent labs & imaging results that were available during my care of the patient were reviewed by me and considered in my medical decision making (see chart for details).  Patient with recently diagnosed kidney stone presenting with complaints of constipation.   He has been taking magnesium citrate, but has not had a significant bowel movement.  He is concerned something else may be wrong.  On exam, his abdomen is somewhat distended and tympanitic.  He had a mildly elevated white count, so a CT scan was obtained.  This showed no evidence for obstruction, volvulus, or other acute intra-abdominal abnormality.  There was only liquid stool throughout the colon which I suspect is related to his magnesium citrate.  I see no indication for further workup at this time.  He is to follow-up for his lithotripsy as scheduled.  Final Clinical Impressions(s) / ED Diagnoses   Final diagnoses:  None    ED Discharge Orders    None       Geoffery Lyonselo, Kierstynn Babich, MD 08/12/17 0230

## 2017-08-12 ENCOUNTER — Encounter (HOSPITAL_COMMUNITY)
Admission: RE | Disposition: A | Discharge status: Home / Self Care | Payer: Self-pay | Source: Ambulatory Visit | Attending: Urology

## 2017-08-12 ENCOUNTER — Encounter (HOSPITAL_COMMUNITY): Payer: Self-pay | Admitting: *Deleted

## 2017-08-12 ENCOUNTER — Encounter (HOSPITAL_COMMUNITY): Payer: Self-pay

## 2017-08-12 ENCOUNTER — Ambulatory Visit (HOSPITAL_COMMUNITY)
Admission: RE | Admit: 2017-08-12 | Discharge: 2017-08-12 | Disposition: A | Discharge status: Home / Self Care | Payer: PRIVATE HEALTH INSURANCE | Source: Ambulatory Visit | Attending: Urology | Admitting: Urology

## 2017-08-12 ENCOUNTER — Ambulatory Visit (HOSPITAL_COMMUNITY): Payer: PRIVATE HEALTH INSURANCE

## 2017-08-12 ENCOUNTER — Emergency Department (HOSPITAL_COMMUNITY): Payer: PRIVATE HEALTH INSURANCE

## 2017-08-12 ENCOUNTER — Other Ambulatory Visit: Payer: Self-pay

## 2017-08-12 DIAGNOSIS — N132 Hydronephrosis with renal and ureteral calculous obstruction: Secondary | ICD-10-CM | POA: Insufficient documentation

## 2017-08-12 DIAGNOSIS — M1990 Unspecified osteoarthritis, unspecified site: Secondary | ICD-10-CM | POA: Insufficient documentation

## 2017-08-12 DIAGNOSIS — N201 Calculus of ureter: Secondary | ICD-10-CM

## 2017-08-12 HISTORY — DX: Personal history of urinary calculi: Z87.442

## 2017-08-12 HISTORY — PX: EXTRACORPOREAL SHOCK WAVE LITHOTRIPSY: SHX1557

## 2017-08-12 LAB — URINALYSIS, ROUTINE W REFLEX MICROSCOPIC
BACTERIA UA: NONE SEEN
Bilirubin Urine: NEGATIVE
GLUCOSE, UA: NEGATIVE mg/dL
KETONES UR: 5 mg/dL — AB
LEUKOCYTES UA: NEGATIVE
NITRITE: NEGATIVE
PH: 7 (ref 5.0–8.0)
Protein, ur: NEGATIVE mg/dL
SPECIFIC GRAVITY, URINE: 1.009 (ref 1.005–1.030)
SQUAMOUS EPITHELIAL / LPF: NONE SEEN

## 2017-08-12 LAB — CBC WITH DIFFERENTIAL/PLATELET
BASOS ABS: 0 10*3/uL (ref 0.0–0.1)
Basophils Relative: 0 %
EOS ABS: 0 10*3/uL (ref 0.0–0.7)
EOS PCT: 0 %
HCT: 40.8 % (ref 39.0–52.0)
Hemoglobin: 14.1 g/dL (ref 13.0–17.0)
Lymphocytes Relative: 14 %
Lymphs Abs: 1.9 10*3/uL (ref 0.7–4.0)
MCH: 31.4 pg (ref 26.0–34.0)
MCHC: 34.6 g/dL (ref 30.0–36.0)
MCV: 90.9 fL (ref 78.0–100.0)
MONO ABS: 1.3 10*3/uL — AB (ref 0.1–1.0)
Monocytes Relative: 9 %
Neutro Abs: 10.5 10*3/uL — ABNORMAL HIGH (ref 1.7–7.7)
Neutrophils Relative %: 77 %
PLATELETS: 242 10*3/uL (ref 150–400)
RBC: 4.49 MIL/uL (ref 4.22–5.81)
RDW: 13.5 % (ref 11.5–15.5)
WBC: 13.7 10*3/uL — AB (ref 4.0–10.5)

## 2017-08-12 LAB — COMPREHENSIVE METABOLIC PANEL
ALK PHOS: 73 U/L (ref 38–126)
ALT: 34 U/L (ref 17–63)
AST: 26 U/L (ref 15–41)
Albumin: 4.5 g/dL (ref 3.5–5.0)
Anion gap: 11 (ref 5–15)
BILIRUBIN TOTAL: 1.3 mg/dL — AB (ref 0.3–1.2)
BUN: 15 mg/dL (ref 6–20)
CO2: 27 mmol/L (ref 22–32)
CREATININE: 1.18 mg/dL (ref 0.61–1.24)
Calcium: 9.4 mg/dL (ref 8.9–10.3)
Chloride: 98 mmol/L — ABNORMAL LOW (ref 101–111)
GFR calc Af Amer: >60 mL/min (ref >60)
GLUCOSE: 124 mg/dL — AB (ref 65–99)
Potassium: 4.2 mmol/L (ref 3.5–5.1)
Sodium: 136 mmol/L (ref 135–145)
TOTAL PROTEIN: 8.4 g/dL — AB (ref 6.5–8.1)

## 2017-08-12 LAB — LIPASE, BLOOD: LIPASE: 18 U/L (ref 11–51)

## 2017-08-12 SURGERY — LITHOTRIPSY, ESWL
Anesthesia: LOCAL | Laterality: Left

## 2017-08-12 MED ORDER — DIAZEPAM 5 MG PO TABS
10.0000 mg | ORAL_TABLET | ORAL | Status: AC
  Administered 2017-08-12: 10 mg via ORAL
  Filled 2017-08-12: qty 2

## 2017-08-12 MED ORDER — TAMSULOSIN HCL 0.4 MG PO CAPS: 0.4000 mg | ORAL_CAPSULE | Freq: Every day | ORAL | 0 refills | Status: DC

## 2017-08-12 MED ORDER — DIPHENHYDRAMINE HCL 25 MG PO CAPS
25.0000 mg | ORAL_CAPSULE | ORAL | Status: AC
Start: 2017-08-12 — End: 2017-08-12
  Administered 2017-08-12: 25 mg via ORAL
  Filled 2017-08-12: qty 1

## 2017-08-12 MED ORDER — IOPAMIDOL (ISOVUE-300) INJECTION 61%
100.0000 mL | Freq: Once | INTRAVENOUS | Status: AC | PRN
  Administered 2017-08-12: 100 mL via INTRAVENOUS

## 2017-08-12 MED ORDER — SENNOSIDES-DOCUSATE SODIUM 8.6-50 MG PO TABS: 1.0000 | ORAL_TABLET | Freq: Two times a day (BID) | ORAL | 0 refills | Status: DC

## 2017-08-12 MED ORDER — CIPROFLOXACIN HCL 500 MG PO TABS
500.0000 mg | ORAL_TABLET | ORAL | Status: AC
  Administered 2017-08-12: 500 mg via ORAL
  Filled 2017-08-12: qty 1

## 2017-08-12 MED ORDER — IOPAMIDOL (ISOVUE-300) INJECTION 61%
INTRAVENOUS | Status: AC
  Administered 2017-08-12: 100 mL via INTRAVENOUS
  Filled 2017-08-12: qty 100

## 2017-08-12 MED ORDER — SODIUM CHLORIDE 0.9 % IV SOLN
INTRAVENOUS | Status: DC
  Administered 2017-08-12: 08:00:00 via INTRAVENOUS

## 2017-08-12 MED ORDER — OXYCODONE-ACETAMINOPHEN 5-325 MG PO TABS: 1.0000 | ORAL_TABLET | Freq: Four times a day (QID) | ORAL | 0 refills | Status: DC | PRN

## 2017-08-12 NOTE — Brief Op Note (Signed)
08/12/2017  9:56 AM  PATIENT:  Carlos Potts  51 y.o. male  PRE-OPERATIVE DIAGNOSIS:  LEFT URETERAL CALCULUS  POST-OPERATIVE DIAGNOSIS:  * No post-op diagnosis entered *  PROCEDURE:  Procedure(s): LEFT EXTRACORPOREAL SHOCK WAVE LITHOTRIPSY (ESWL) (Left)  SURGEON:  Surgeon(s) and Role:    * Alexis Frock, MD - Primary  PHYSICIAN ASSISTANT:   ASSISTANTS: none   ANESTHESIA:   MAC  EBL:  none   BLOOD ADMINISTERED:none  DRAINS: none   LOCAL MEDICATIONS USED:  NONE  SPECIMEN:  No Specimen  DISPOSITION OF SPECIMEN:  N/A  COUNTS:  YES  TOURNIQUET:  * No tourniquets in log *  DICTATION: .Note written in paper chart  PLAN OF CARE: Discharge to home after PACU  PATIENT DISPOSITION:  Short Stay   Delay start of Pharmacological VTE agent (>24hrs) due to surgical blood loss or risk of bleeding: yes

## 2017-08-12 NOTE — Discharge Instructions (Signed)
1 - You may have urinary urgency (bladder spasms), pass small stone fragments,  and bloody urine on / off x few days. This is normal. ° °2 - Call MD or go to ER for fever >102, severe pain / nausea / vomiting not relieved by medications, or acute change in medical status ° °

## 2017-08-12 NOTE — Discharge Instructions (Signed)
Follow-up with urology to undergo your lithotripsy as scheduled.  Return to the emergency department if your symptoms significantly worsen or change.

## 2017-08-12 NOTE — H&P (Signed)
Carlos Potts is an 51 y.o. male.    Chief Complaint: Pre-op LEFT Shockwave LIthotripsy  HPI:   1 - LEFT Ureteral Stone - 9m left ureteral stone by CT and KUB 07/2017 on eval left flank pain. Stone is [id number], SSD 18cm, and solitary just above left L4 transverse process. Most recent UA without infectious parameters.  Today "[name] seen to proceed with LEFT shockwave lithotripsy. He has had constipation after recent PO narcotic use, but CT yesterday reassuring as no other concerning findings.   Past Medical History:  Diagnosis Date  . Arthritis   . Back ache   . Kidney stone   . Seasonal allergies   . Tinnitus     Past Surgical History:  Procedure Laterality Date  . TONSILLECTOMY      Family History  Problem Relation Age of Onset  . Cancer Father   . Cancer Sister    Social History:  reports that  has never smoked. he has never used smokeless tobacco. He reports that he does not drink alcohol or use drugs.  Allergies: No Known Allergies  No medications prior to admission.    Results for orders placed or performed during the hospital encounter of 08/11/17 (from the past 48 hour(s))  Comprehensive metabolic panel     Status: Abnormal   Collection Time: 08/12/17 12:32 AM  Result Value Ref Range   Sodium 136 135 - 145 mmol/L   Potassium 4.2 3.5 - 5.1 mmol/L   Chloride 98 (L) 101 - 111 mmol/L   CO2 27 22 - 32 mmol/L   Glucose, Bld 124 (H) 65 - 99 mg/dL   BUN 15 6 - 20 mg/dL   Creatinine, Ser 1.18 0.61 - 1.24 mg/dL   Calcium 9.4 8.9 - 10.3 mg/dL   Total Protein 8.4 (H) 6.5 - 8.1 g/dL   Albumin 4.5 3.5 - 5.0 g/dL   AST 26 15 - 41 U/L   ALT 34 17 - 63 U/L   Alkaline Phosphatase 73 38 - 126 U/L   Total Bilirubin 1.3 (H) 0.3 - 1.2 mg/dL   GFR calc non Af Amer >60 >60 mL/min   GFR calc Af Amer >60 >60 mL/min    Comment: (NOTE) The eGFR has been calculated using the CKD EPI equation. This calculation has not been validated in all clinical situations. eGFR's  persistently <60 mL/min signify possible Chronic Kidney Disease.    Anion gap 11 5 - 15  CBC with Differential     Status: Abnormal   Collection Time: 08/12/17 12:32 AM  Result Value Ref Range   WBC 13.7 (H) 4.0 - 10.5 K/uL   RBC 4.49 4.22 - 5.81 MIL/uL   Hemoglobin 14.1 13.0 - 17.0 g/dL   HCT 40.8 39.0 - 52.0 %   MCV 90.9 78.0 - 100.0 fL   MCH 31.4 26.0 - 34.0 pg   MCHC 34.6 30.0 - 36.0 g/dL   RDW 13.5 11.5 - 15.5 %   Platelets 242 150 - 400 K/uL   Neutrophils Relative % 77 %   Neutro Abs 10.5 (H) 1.7 - 7.7 K/uL   Lymphocytes Relative 14 %   Lymphs Abs 1.9 0.7 - 4.0 K/uL   Monocytes Relative 9 %   Monocytes Absolute 1.3 (H) 0.1 - 1.0 K/uL   Eosinophils Relative 0 %   Eosinophils Absolute 0.0 0.0 - 0.7 K/uL   Basophils Relative 0 %   Basophils Absolute 0.0 0.0 - 0.1 K/uL  Lipase, blood  Status: None   Collection Time: 08/12/17 12:32 AM  Result Value Ref Range   Lipase 18 11 - 51 U/L  Urinalysis, Routine w reflex microscopic     Status: Abnormal   Collection Time: 08/12/17 12:32 AM  Result Value Ref Range   Color, Urine YELLOW YELLOW   APPearance CLEAR CLEAR   Specific Gravity, Urine 1.009 1.005 - 1.030   pH 7.0 5.0 - 8.0   Glucose, UA NEGATIVE NEGATIVE mg/dL   Hgb urine dipstick SMALL (A) NEGATIVE   Bilirubin Urine NEGATIVE NEGATIVE   Ketones, ur 5 (A) NEGATIVE mg/dL   Protein, ur NEGATIVE NEGATIVE mg/dL   Nitrite NEGATIVE NEGATIVE   Leukocytes, UA NEGATIVE NEGATIVE   RBC / HPF 0-5 0 - 5 RBC/hpf   WBC, UA 0-5 0 - 5 WBC/hpf   Bacteria, UA NONE SEEN NONE SEEN   Squamous Epithelial / LPF NONE SEEN NONE SEEN   Ct Abdomen Pelvis W Contrast  Result Date: 08/12/2017 CLINICAL DATA:  Abdominal pain and bloating. Abdominal distension. Nausea and vomiting. EXAM: CT ABDOMEN AND PELVIS WITH CONTRAST TECHNIQUE: Multidetector CT imaging of the abdomen and pelvis was performed using the standard protocol following bolus administration of intravenous contrast. CONTRAST:  100 cc  Isovue-300 IV COMPARISON:  CT 9 days prior 08/03/2017 FINDINGS: Lower chest: The lung bases are clear. Coronary artery calcifications are noted. Hepatobiliary: Decreased hepatic density consistent with steatosis. No focal hepatic lesion. Gallbladder physiologically distended, no calcified stone. No biliary dilatation. Pancreas: No ductal dilatation or inflammation. Spleen: Normal in size without focal abnormality. Adrenals/Urinary Tract: No adrenal nodule. 6 mm stone previously in the lower pole left kidney is now in the left proximal ureter with mild hydronephrosis and perinephric edema. Ureter distal to this is decompressed. No right hydronephrosis or stone. Right renal collecting system is partially duplicated. Urinary bladder is physiologically distended without wall thickening. Stomach/Bowel: Colon is diffusely fluid-filled. No formed stool. No colonic wall thickening or pericolonic inflammation. Small bowel is decompressed. Stomach is nondistended. Vascular/Lymphatic: Minimal aortic atherosclerosis. No aneurysm. No adenopathy. Reproductive: Prostate is unremarkable. Other: No free air, free fluid, or intra-abdominal fluid collection. Musculoskeletal: There are no acute or suspicious osseous abnormalities. IMPRESSION: 1. Obstructing 6 mm stone in the left proximal ureter with mild hydronephrosis and perinephric edema. 2. Liquid stool throughout the colon without colonic inflammation or wall thickening, no bowel obstruction. Majority of the small bowel is decompressed. 3. Hepatic steatosis. Electronically Signed   By: Jeb Levering M.D.   On: 08/12/2017 02:20    Review of Systems  Constitutional: Negative.  Negative for chills and fever.  HENT: Negative.   Eyes: Negative.   Respiratory: Negative.  Negative for cough.   Cardiovascular: Negative.   Gastrointestinal: Positive for constipation.  Genitourinary: Positive for flank pain.  Skin: Negative.   Neurological: Negative.   Endo/Heme/Allergies:  Negative.   Psychiatric/Behavioral: Negative.     Height _0  (1.727 m), weight 101.6 kg (224 lb). Physical Exam  Constitutional: He appears well-developed.  HENT:  Head: Normocephalic.  Neck: Normal range of motion.  Cardiovascular: Normal rate.  Respiratory: Effort normal.  GI: Soft.  Moderate truncal obesity  Genitourinary:  Genitourinary Comments: Mild left CVAT at present.  Musculoskeletal: Normal range of motion.  Neurological: He is alert.  Skin: Skin is warm.  Psychiatric: He has a normal mood and affect.     Assessment/Plan  Proceed as planned with LEFT shockwave lithotripsy. Risks, benefits, alternatives, expected peri-op course discussed.   Alexis Frock, MD  08/12/2017, 6:25 AM

## 2017-08-15 ENCOUNTER — Encounter: Payer: Self-pay | Admitting: Osteopathic Medicine

## 2017-08-15 ENCOUNTER — Ambulatory Visit (INDEPENDENT_AMBULATORY_CARE_PROVIDER_SITE_OTHER): Payer: PRIVATE HEALTH INSURANCE | Admitting: Osteopathic Medicine

## 2017-08-15 VITALS — BP 119/80 | HR 81 | Temp 97.7°F | Ht 68.0 in | Wt 211.1 lb

## 2017-08-15 DIAGNOSIS — Z13220 Encounter for screening for lipoid disorders: Secondary | ICD-10-CM

## 2017-08-15 DIAGNOSIS — R946 Abnormal results of thyroid function studies: Secondary | ICD-10-CM | POA: Diagnosis not present

## 2017-08-15 DIAGNOSIS — N200 Calculus of kidney: Secondary | ICD-10-CM | POA: Diagnosis not present

## 2017-08-15 DIAGNOSIS — Z0000 Encounter for general adult medical examination without abnormal findings: Secondary | ICD-10-CM | POA: Diagnosis not present

## 2017-08-15 LAB — LIPID PANEL
CHOL/HDL RATIO: 3.8 (calc) (ref <5.0)
Cholesterol: 153 mg/dL (ref <200)
HDL: 40 mg/dL — ABNORMAL LOW (ref >40)
LDL CHOLESTEROL (CALC): 94 mg/dL
NON-HDL CHOLESTEROL (CALC): 113 mg/dL (ref <130)
TRIGLYCERIDES: 92 mg/dL (ref <150)

## 2017-08-15 LAB — T4, FREE: Free T4: 1.2 ng/dL (ref 0.8–1.8)

## 2017-08-15 LAB — TSH: TSH: 1.67 mIU/L (ref 0.40–4.50)

## 2017-08-15 NOTE — Progress Notes (Signed)
HPI: Whitney PostRobert Tino is a 51 y.o. male  who [facility] today, 08/15/17,  for chief complaint of:  Chief Complaint  Patient presents with  . Annual Exam    Preventive care reviewed as below - no complaints today.  Recently in ER for kidney stones - admitted for lithotripsy treatment, doing okay after this. Some complications from constipation d/t opiate pain meds.   BP elevated on intake. Recheck prior to d/c was improved. No CP/SOB. Recent BP ok except for ER visits, elevated d/t pain.    Past medical, surgical, social and family history reviewed: Past Medical History:  Diagnosis Date  . Arthritis   . Back ache   . History of kidney stones   . Kidney stone   . Seasonal allergies   . Tinnitus    Past Surgical History:  Procedure Laterality Date  . EXTRACORPOREAL SHOCK WAVE LITHOTRIPSY Left 08/12/2017   Procedure: LEFT EXTRACORPOREAL SHOCK WAVE LITHOTRIPSY (ESWL);  Surgeon: Sebastian AcheManny, Theodore, MD;  Location: [facility];  Service: Urology;  Laterality: Left;  . TONSILLECTOMY     Social History   Tobacco Use  . Smoking status: Never Smoker  . Smokeless tobacco: Never Used  Substance Use Topics  . Alcohol use: No   Family History  Problem Relation Age of Onset  . Cancer Father   . Cancer Sister      Current medication list and allergy/intolerance information reviewed:   Current Outpatient Medications  Medication Sig Dispense Refill  . cyclobenzaprine (FLEXERIL) 5 MG tablet Take 1 tablet (5 mg total) by mouth 3 (three) times daily as needed for muscle spasms. 30 tablet 0  . ibuprofen (ADVIL,MOTRIN) 200 MG tablet Take 400-600 mg by mouth every 6 (six) hours as needed for headache, mild pain or moderate pain.    . magnesium citrate SOLN Take 1 bottle daily as needed for constipation.    . ondansetron (ZOFRAN ODT) 4 MG disintegrating tablet Take 1 tablet (4 mg total) by mouth every 8 (eight) hours as needed for nausea or vomiting.  20 tablet 0  . oxyCODONE-acetaminophen (PERCOCET/ROXICET) 5-325 MG tablet Take 1-2 tablets by mouth every 6 (six) hours as needed for moderate pain or severe pain. After lithotripsy 20 tablet 0  . senna-docusate (SENOKOT-S) 8.6-50 MG tablet Take 1 tablet by mouth 2 (two) times daily. While taking strong pain meds to prevent constipation. 20 tablet 0  . tamsulosin (FLOMAX) 0.4 MG CAPS capsule Take 1 capsule (0.4 mg total) by mouth daily. To help pass kidney stone fragments 14 capsule 0   No current facility-administered medications for this visit.    No Known Allergies    Review of Systems:  Constitutional:  No  fever, no chills, No recent illness, No unintentional weight changes. No significant fatigue.   HEENT: No  sinus pressure  Cardiac: No  chest pain, No  pressure, No palpitations  Respiratory:  No  shortness of breath. No  Cough  Gastrointestinal: (+) recent issues with abdominal pain see HPI   Musculoskeletal: No new myalgia/arthralgia  Skin: No  Rash  Hem/Onc: No  easy bruising/bleeding, No  abnormal lymph node  Endocrine: No cold intolerance,  No heat intolerance. No polyuria/polydipsia/polyphagia   Neurologic: No  weakness, No  dizziness,   Psychiatric: No  concerns with depression, No  concerns with anxiety, No sleep problems, No mood problems  Exam:  BP (!) 142/76   Pulse 81   Temp 97.7 F (36.5 C) (Oral)  Ht 5\' 8"  (1.727 m)   Wt 211 lb 1.3 oz (95.7 kg)   BMI 32.09 kg/m   Constitutional: VS see above. General Appearance: alert, well-developed, well-nourished, NAD  Eyes: Normal lids and conjunctive, non-icteric sclera  Ears, Nose, Mouth, Throat: MMM, Normal external inspection ears/nares/mouth/lips/gums. No thyromegaly appreciated.  Neck: No masses, trachea midline. No thyroid enlargement. No tenderness/mass appreciated. No lymphadenopathy. TM normal bilaterally.  Respiratory: Normal respiratory effort. no wheeze, no rhonchi, no rales  Cardiovascular:  S1/S2 normal, no murmur, no rub/gallop auscultated. RRR. No lower extremity edema.   GI: Nontender, nondistended, bowel sounds within normal limits 4.  Skin: warm, dry, intact. No rash/ulcer.   Psychiatric: Normal judgment/insight. Normal mood and affect. Oriented x3.       ASSESSMENT/PLAN:   Annual physical exam  Kidney stone  Abnormal thyroid function test - Plan: T4, free, TSH  Lipid screening - Plan: Lipid panel    MALE PREVENTIVE CARE updated 08/15/17  ANNUAL SCREENING/COUNSELING  Any changes to health in the past year? no  Tobacco - never   Alcohol - none  Diet/Exercise - Healthy habits discussed to decrease CV risk and promote overall health. Patient does not have dietary restrictions.   Depression - PQH2 Negative  Feel safe at home? - yes  HTN SCREENING - SEE VITALS  SEXUAL/REPRODUCTIVE HEALTH  Sexually active? - Yes with male.  STI testing needed/desired today? - no  Any concerns with testosterone/libido? - no  INFECTIOUS DISEASE SCREENING  HIV - does not need  GC/CT - does not need  HepC - does not need  TB - does not need  CANCER SCREENING  Lung - does not need  Colon - recently seen GI and scheduled this - upcoming   Prostate - does not need  OTHER DISEASE SCREENING  Lipid - does not need  DM2 - does not need//  Osteoporosis - does not need  ADULT VACCINATION  Influenza - annual vaccine recommended  Td - was given  Zoster - was not indicated  Pneumonia - was not indicated     Visit summary with medication list and pertinent instructions was printed for patient to review. All questions at time of visit were answered - patient instructed to contact office with any additional concerns. ER/RTC precautions were reviewed with the patient.   Follow-up plan: [facility], sooner if needed .

## 2017-08-17 HISTORY — PX: OTHER SURGICAL HISTORY: SHX169

## 2017-08-21 ENCOUNTER — Other Ambulatory Visit: Payer: Self-pay

## 2017-08-21 ENCOUNTER — Encounter (HOSPITAL_COMMUNITY): Payer: Self-pay | Admitting: *Deleted

## 2017-08-21 ENCOUNTER — Other Ambulatory Visit: Payer: Self-pay | Admitting: Urology

## 2017-08-21 NOTE — Progress Notes (Signed)
LEFT MESSAGE WITH CONNIE MAGE TO LET DR Laverle PatterBORDEN KNOW PATIENT WILL BE SAME DAY LABS DUE TO OUT OF TOWN UNTIL LATE THURSDAY EVENING.

## 2017-08-22 NOTE — Anesthesia Preprocedure Evaluation (Addendum)
Anesthesia Evaluation  Patient identified by MRN, date of birth, ID band Patient awake    Reviewed: Allergy & Precautions, NPO status , Patient's Chart, lab work & pertinent test results  History of Anesthesia Complications Negative for: history of anesthetic complications  Airway Mallampati: II  TM Distance: >3 FB Neck ROM: Full    Dental  (+) Dental Advisory Given   Pulmonary neg pulmonary ROS,    breath sounds clear to auscultation       Cardiovascular (-) anginanegative cardio ROS   Rhythm:Regular Rate:Normal     Neuro/Psych negative neurological ROS     GI/Hepatic Neg liver ROS, GERD  Controlled,  Endo/Other  Morbid obesity  Renal/GU stones     Musculoskeletal   Abdominal (+) + obese,   Peds  Hematology negative hematology ROS (+)   Anesthesia Other Findings   Reproductive/Obstetrics                            Anesthesia Physical Anesthesia Plan  ASA: II  Anesthesia Plan: General   Post-op Pain Management:    Induction: Intravenous  PONV Risk Score and Plan: 2 and Ondansetron and Dexamethasone  Airway Management Planned: LMA  Additional Equipment:   Intra-op Plan:   Post-operative Plan:   Informed Consent: I have reviewed the patients History and Physical, chart, labs and discussed the procedure including the risks, benefits and alternatives for the proposed anesthesia with the patient or authorized representative who has indicated his/her understanding and acceptance.   Dental advisory given  Plan Discussed with: CRNA and Surgeon  Anesthesia Plan Comments: (Plan routine monitors, GA- LMA OK)        Anesthesia Quick Evaluation

## 2017-08-23 ENCOUNTER — Ambulatory Visit (HOSPITAL_COMMUNITY): Payer: PRIVATE HEALTH INSURANCE | Admitting: Anesthesiology

## 2017-08-23 ENCOUNTER — Ambulatory Visit (HOSPITAL_COMMUNITY): Payer: PRIVATE HEALTH INSURANCE

## 2017-08-23 ENCOUNTER — Encounter (HOSPITAL_COMMUNITY)
Admission: RE | Disposition: A | Discharge status: Home / Self Care | Payer: Self-pay | Source: Ambulatory Visit | Attending: Urology

## 2017-08-23 ENCOUNTER — Ambulatory Visit (HOSPITAL_COMMUNITY)
Admission: RE | Admit: 2017-08-23 | Discharge: 2017-08-23 | Disposition: A | Discharge status: Home / Self Care | Payer: PRIVATE HEALTH INSURANCE | Source: Ambulatory Visit | Attending: Urology | Admitting: Urology

## 2017-08-23 ENCOUNTER — Other Ambulatory Visit: Payer: Self-pay

## 2017-08-23 ENCOUNTER — Encounter (HOSPITAL_COMMUNITY): Payer: Self-pay | Admitting: *Deleted

## 2017-08-23 DIAGNOSIS — N201 Calculus of ureter: Secondary | ICD-10-CM | POA: Diagnosis not present

## 2017-08-23 DIAGNOSIS — K219 Gastro-esophageal reflux disease without esophagitis: Secondary | ICD-10-CM | POA: Diagnosis not present

## 2017-08-23 DIAGNOSIS — Z6831 Body mass index (BMI) 31.0-31.9, adult: Secondary | ICD-10-CM | POA: Insufficient documentation

## 2017-08-23 DIAGNOSIS — Z79899 Other long term (current) drug therapy: Secondary | ICD-10-CM | POA: Diagnosis not present

## 2017-08-23 HISTORY — DX: Gastro-esophageal reflux disease without esophagitis: K21.9

## 2017-08-23 HISTORY — PX: CYSTOSCOPY/URETEROSCOPY/HOLMIUM LASER/STENT PLACEMENT: SHX6546

## 2017-08-23 SURGERY — CYSTOSCOPY/URETEROSCOPY/HOLMIUM LASER/STENT PLACEMENT
Anesthesia: General | Laterality: Left

## 2017-08-23 MED ORDER — OXYCODONE-ACETAMINOPHEN 5-325 MG PO TABS: 1.0000 | ORAL_TABLET | ORAL | 0 refills | Status: DC | PRN

## 2017-08-23 MED ORDER — PHENAZOPYRIDINE HCL 200 MG PO TABS
200.0000 mg | ORAL_TABLET | Freq: Once | ORAL | Status: AC
  Administered 2017-08-23: 200 mg via ORAL
  Filled 2017-08-23: qty 1

## 2017-08-23 MED ORDER — PHENAZOPYRIDINE HCL 200 MG PO TABS: 200.0000 mg | ORAL_TABLET | Freq: Three times a day (TID) | ORAL | 0 refills | Status: DC | PRN

## 2017-08-23 MED ORDER — FENTANYL CITRATE (PF) 100 MCG/2ML IJ SOLN
INTRAMUSCULAR | Status: DC | PRN
  Administered 2017-08-23: 25 ug via INTRAVENOUS
  Administered 2017-08-23: 50 ug via INTRAVENOUS
  Administered 2017-08-23: 25 ug via INTRAVENOUS

## 2017-08-23 MED ORDER — CEFAZOLIN SODIUM-DEXTROSE 2-4 GM/100ML-% IV SOLN
INTRAVENOUS | Status: AC
  Filled 2017-08-23: qty 100

## 2017-08-23 MED ORDER — MIDAZOLAM HCL 2 MG/2ML IJ SOLN
INTRAMUSCULAR | Status: AC
  Filled 2017-08-23: qty 2

## 2017-08-23 MED ORDER — LACTATED RINGERS IV SOLN
INTRAVENOUS | Status: DC | PRN
  Administered 2017-08-23 (×2): via INTRAVENOUS

## 2017-08-23 MED ORDER — FENTANYL CITRATE (PF) 100 MCG/2ML IJ SOLN
INTRAMUSCULAR | Status: AC
  Filled 2017-08-23: qty 2

## 2017-08-23 MED ORDER — PROPOFOL 10 MG/ML IV BOLUS
INTRAVENOUS | Status: AC
  Filled 2017-08-23: qty 20

## 2017-08-23 MED ORDER — MEPERIDINE HCL 50 MG/ML IJ SOLN: 6.2500 mg | INTRAMUSCULAR | Status: DC | PRN

## 2017-08-23 MED ORDER — LIDOCAINE 2% (20 MG/ML) 5 ML SYRINGE
INTRAMUSCULAR | Status: DC | PRN
  Administered 2017-08-23: 60 mg via INTRAVENOUS

## 2017-08-23 MED ORDER — SODIUM CHLORIDE 0.9 % IR SOLN
Status: DC | PRN
  Administered 2017-08-23: 3000 mL

## 2017-08-23 MED ORDER — OXYCODONE-ACETAMINOPHEN 5-325 MG PO TABS
1.0000 | ORAL_TABLET | Freq: Once | ORAL | Status: AC
  Administered 2017-08-23: 1 via ORAL
  Filled 2017-08-23: qty 1

## 2017-08-23 MED ORDER — MIDAZOLAM HCL 2 MG/2ML IJ SOLN: 0.5000 mg | Freq: Once | INTRAMUSCULAR | Status: DC | PRN

## 2017-08-23 MED ORDER — HYDROMORPHONE HCL 1 MG/ML IJ SOLN
0.2500 mg | INTRAMUSCULAR | Status: DC | PRN
  Administered 2017-08-23 (×2): 0.5 mg via INTRAVENOUS

## 2017-08-23 MED ORDER — BELLADONNA-OPIUM 16.2-30 MG RE SUPP
1.0000 | Freq: Once | RECTAL | Status: AC
  Administered 2017-08-23: 1 via RECTAL
  Filled 2017-08-23: qty 1

## 2017-08-23 MED ORDER — SODIUM CHLORIDE 0.9 % IR SOLN
Status: DC | PRN
  Administered 2017-08-23: 1000 mL

## 2017-08-23 MED ORDER — PROMETHAZINE HCL 25 MG/ML IJ SOLN: 6.2500 mg | INTRAMUSCULAR | Status: DC | PRN

## 2017-08-23 MED ORDER — PROPOFOL 10 MG/ML IV BOLUS
INTRAVENOUS | Status: DC | PRN
  Administered 2017-08-23: 160 mg via INTRAVENOUS

## 2017-08-23 MED ORDER — CEFAZOLIN SODIUM-DEXTROSE 2-4 GM/100ML-% IV SOLN
2.0000 g | Freq: Once | INTRAVENOUS | Status: AC
  Administered 2017-08-23: 2 g via INTRAVENOUS

## 2017-08-23 MED ORDER — HYDROMORPHONE HCL 1 MG/ML IJ SOLN
INTRAMUSCULAR | Status: AC
  Filled 2017-08-23: qty 1

## 2017-08-23 MED ORDER — MIDAZOLAM HCL 5 MG/5ML IJ SOLN
INTRAMUSCULAR | Status: DC | PRN
  Administered 2017-08-23: 2 mg via INTRAVENOUS

## 2017-08-23 SURGICAL SUPPLY — 17 items
BAG URO CATCHER STRL LF (MISCELLANEOUS) ×3 IMPLANT
BASKET ZERO TIP NITINOL 2.4FR (BASKET) ×3 IMPLANT
CATH INTERMIT  6FR 70CM (CATHETERS) IMPLANT
CLOTH BEACON ORANGE TIMEOUT ST (SAFETY) ×3 IMPLANT
COVER FOOTSWITCH UNIV (MISCELLANEOUS) ×3 IMPLANT
FIBER LASER FLEXIVA 365 (UROLOGICAL SUPPLIES) IMPLANT
FIBER LASER TRAC TIP (UROLOGICAL SUPPLIES) ×3 IMPLANT
GLOVE BIOGEL M STRL SZ7.5 (GLOVE) ×3 IMPLANT
GOWN STRL REUS W/TWL LRG LVL3 (GOWN DISPOSABLE) ×6 IMPLANT
GUIDEWIRE ANG ZIPWIRE 038X150 (WIRE) IMPLANT
GUIDEWIRE STR DUAL SENSOR (WIRE) ×3 IMPLANT
MANIFOLD NEPTUNE II (INSTRUMENTS) ×3 IMPLANT
PACK CYSTO (CUSTOM PROCEDURE TRAY) ×3 IMPLANT
SHEATH URETERAL 12FRX35CM (MISCELLANEOUS) ×3 IMPLANT
STENT URET 6FRX24 CONTOUR (STENTS) ×3 IMPLANT
TUBING CONNECTING 10 (TUBING) ×2 IMPLANT
TUBING CONNECTING 10' (TUBING) ×1

## 2017-08-23 NOTE — Progress Notes (Addendum)
Discharge instructions reviewed with pt and wife, both verbalize understanding of d/c instructions and have no further questions.  Pt told to take out stent on Tuesday.  Pt told to call office for any concerns such as clots, increased pain, increased bleeding. Pt is resting at this time.  Also informed to go to ER should any emergent symptoms arise. Pt urinated, had amber colored urine with miniscule amount of sediment, rust in color, no clots. Pt alert and oriented and tolerating fluids and saltine crackers, pain improved.

## 2017-08-23 NOTE — Discharge Instructions (Addendum)
1. You may see some blood in the urine and may have some burning with urination for 48-72 hours. You also may notice that you have to urinate more frequently or urgently after your procedure which is normal.  2. You should call should you develop an inability urinate, fever > 101, persistent nausea and vomiting that prevents you from eating or drinking to stay hydrated.  3. If you have a stent, you will likely urinate more frequently and urgently until the stent is removed and you may experience some discomfort/pain in the lower abdomen and flank especially when urinating. You may take pain medication prescribed to you if needed for pain. You may also intermittently have blood in the urine until the stent is removed. 4. You may remove your stent on Tuesday morning.  Simply pull the string that is taped to your body and the stent will easily come out.  This may be best done in the shower as some urine may come out with the stent.  Usually you will feel relief once the stent is removed, but occasionally patients can develop pain due to residual swelling of the ureter that may temporarily obstruct the kidney.  This can be managed by taking pain medication and it will typically resolve with time.  Please do not hesitate to call if you have pain that is not controlled with your pain medication or does not improved within 24-48 hours.   Cystoscopy, Care After Refer to this sheet in the next few weeks. These instructions provide you with information about caring for yourself after your procedure. Your health care provider may also give you more specific instructions. Your treatment has been planned according to current medical practices, but problems sometimes occur. Call your health care provider if you have any problems or questions after your procedure. What can I expect after the procedure? After the procedure, it is common to have:  Mild pain when you urinate. Pain should stop within a few minutes after you  urinate. This may last for up to 1 week.  A small amount of blood in your urine for several days.  Feeling like you need to urinate but producing only a small amount of urine.  Follow these instructions at home:  Medicines  Take over-the-counter and prescription medicines only as told by your health care provider.  If you were prescribed an antibiotic medicine, take it as told by your health care provider. Do not stop taking the antibiotic even if you start to feel better. General instructions   Return to your normal activities as told by your health care provider. Ask your health care provider what activities are safe for you.  Do not drive for 24 hours if you received a sedative.  Watch for any blood in your urine. If the amount of blood in your urine increases, call your health care provider.  Follow instructions from your health care provider about eating or drinking restrictions.  If a tissue sample was removed for testing (biopsy) during your procedure, it is your responsibility to get your test results. Ask your health care provider or the department performing the test when your results will be ready.  Drink enough fluid to keep your urine clear or pale yellow.  Keep all follow-up visits as told by your health care provider. This is important. Contact a health care provider if:  You have pain that gets worse or does not get better with medicine, especially pain when you urinate.  You have difficulty urinating.  Get help right away if:  You have more blood in your urine.  You have blood clots in your urine.  You have abdominal pain.  You have a fever or chills.  You are unable to urinate. This information is not intended to replace advice given to you by your health care provider. Make sure you discuss any questions you have with your health care provider. Document Released: 03/02/2005 Document Revised: 01/19/2016 Document Reviewed: 06/30/2015 Elsevier Interactive  Patient Education  2018 [employer]  General Anesthesia, Adult, Care After These instructions provide you with information about caring for yourself after your procedure. Your health care provider may also give you more specific instructions. Your treatment has been planned according to current medical practices, but problems sometimes occur. Call your health care provider if you have any problems or questions after your procedure. What can I expect after the procedure? After the procedure, it is common to have:  Vomiting.  A sore throat.  Mental slowness.  It is common to feel:  Nauseous.  Cold or shivery.  Sleepy.  Tired.  Sore or achy, even in parts of your body where you did not have surgery.  Follow these instructions at home: For at least 24 hours after the procedure:  Do not: ? Participate in activities where you could fall or become injured. ? Drive. ? Use heavy machinery. ? Drink alcohol. ? Take sleeping pills or medicines that cause drowsiness. ? Make important decisions or sign legal documents. ? Take care of children on your own.  Rest. Eating and drinking  If you vomit, drink water, juice, or soup when you can drink without vomiting.  Drink enough fluid to keep your urine clear or pale yellow.  Make sure you have little or no nausea before eating solid foods.  Follow the diet recommended by your health care provider. General instructions  Have a responsible adult stay with you until you are awake and alert.  Return to your normal activities as told by your health care provider. Ask your health care provider what activities are safe for you.  Take over-the-counter and prescription medicines only as told by your health care provider.  If you smoke, do not smoke without supervision.  Keep all follow-up visits as told by your health care provider. This is important. Contact a health care provider if:  You continue to have nausea or vomiting at  home, and medicines are not helpful.  You cannot drink fluids or start eating again.  You cannot urinate after 8-12 hours.  You develop a skin rash.  You have fever.  You have increasing redness at the site of your procedure. Get help right away if:  You have difficulty breathing.  You have chest pain.  You have unexpected bleeding.  You feel that you are having a life-threatening or urgent problem. This information is not intended to replace advice given to you by your health care provider. Make sure you discuss any questions you have with your health care provider. Document Released: 11/19/2000 Document Revised: 01/16/2016 Document Reviewed: 07/28/2015 Elsevier Interactive Patient Education  [employer]2018 [employer] Inc.

## 2017-08-23 NOTE — Anesthesia Procedure Notes (Signed)
Procedure Name: LMA Insertion Performed by: Adleigh Mcmasters J, CRNA Pre-anesthesia Checklist: Patient identified, Emergency Drugs available, Suction available, Patient being monitored and Timeout performed Patient Re-evaluated:Patient Re-evaluated prior to induction Oxygen Delivery Method: Circle system utilized Preoxygenation: Pre-oxygenation with 100% oxygen Induction Type: IV induction Ventilation: Mask ventilation without difficulty LMA: LMA inserted LMA Size: 4.0 Number of attempts: 1 Placement Confirmation: positive ETCO2,  CO2 detector and breath sounds checked- equal and bilateral Tube secured with: Tape Dental Injury: Teeth and Oropharynx as per pre-operative assessment        

## 2017-08-23 NOTE — Op Note (Signed)
Preoperative diagnosis: Left ureteral calculus  Postoperative diagnosis: Left ureteral calculus  Procedure:  1. Cystoscopy 2. Left ureteroscopy and stone removal 3. Ureteroscopic laser lithotripsy 4. Left ureteral stent placement (6 x 24 with string)  Surgeon: Vanderbilt Ranieri S. Deyci Gesell, Montez HagemanJr. M.D.Rolly Salter  Anesthesia: General  Complications: None  Intraoperative findings: Fluoroscopy demonstrated an indwelling left ureteral stent and stone within the proximal left ureter consistent with the patient's known calculus without other abnormalities noted.  EBL: Minimal  Specimens: 1. Left ureteral calculus  Disposition of specimens: [facility] for stone analysis  Indication: Carlos Potts  is a 51 y.o. patient with urolithiasis. He developed a symptomatic left ureteral stone and failed ESWL.  He developed uncontrolled pain requiring a ureteral stent while out of town for Christmas.  He presents today for definitive management. After reviewing the management options for treatment, they elected to proceed with the above surgical procedure(s). We have discussed the potential benefits and risks of the procedure, side effects of the proposed treatment, the likelihood of the patient achieving the goals of the procedure, and any potential problems that might occur during the procedure or recuperation. Informed consent has been obtained.  Description of procedure:  The patient was taken to the operating room and general anesthesia was induced.  The patient was placed in the dorsal lithotomy position, prepped and draped in the usual sterile fashion, and preoperative antibiotics were administered. A preoperative time-out was performed.   Cystourethroscopy was performed.  The patient's urethra was examined and was normal. The bladder was then systematically examined in its entirety. There was no evidence for any bladder tumors, stones, or other mucosal pathology.   The indwelling left ureteral stent  was identified and fluoroscopy demonstrated findings as above.  A 0.38 sensor guidewire was then advanced up the left ureter into the renal pelvis under fluoroscopic guidance.  A 12/14 Fr ureteral access sheath was then advanced over the guide wire. The digital flexible ureteroscope was then advanced through the access sheath into the ureter next to the guidewire and the calculus was identified and was located in the proximal left ureter and was manipulated up into the renal collecting system.   The stone was then fragmented with the 200 micron holmium laser fiber on a setting of 0.6 J and frequency of 6 Hz.   All sizable stones were then removed with a zero tip nitinol basket.  Reinspection of the ureter/renal pelvis revealed no remaining visible stones or fragments of significant size.   The safety wire was then replaced and the access sheath removed.  The guidewire was backloaded through the cystoscope and a ureteral stent was advance over the wire using Seldinger technique.  The stent was positioned appropriately under fluoroscopic and cystoscopic guidance.  The wire was then removed with an adequate stent curl noted in the renal pelvis as well as in the bladder. A string tether was left on the stent.  The bladder was then emptied and the procedure ended.  The patient appeared to tolerate the procedure well and without complications.  The patient was able to be awakened and transferred to the recovery unit in satisfactory condition.   Carlos Potts, Jr. MD

## 2017-08-23 NOTE — OR Nursing (Signed)
Stone specimen taken by Dr. Laverle PatterBorden post case

## 2017-08-23 NOTE — Transfer of Care (Signed)
Immediate Anesthesia Transfer of Care Note  Patient: Carlos Potts  Procedure(s) Performed: CYSTOSCOPY/RETROGRADE/URETEROSCOPY/HOLMIUM LASER/STENT PLACEMENT (Left )  Patient Location: PACU  Anesthesia Type:General  Level of Consciousness: awake, alert  and oriented  Airway & Oxygen Therapy: Patient Spontanous Breathing and Patient connected to face mask oxygen  Post-op Assessment: Report given to RN and Post -op Vital signs reviewed and stable  Post vital signs: Reviewed and stable  Last Vitals:  Vitals:   08/23/17 0535  BP: 122/90  Pulse: 80  Resp: 18  Temp: 36.4 C  SpO2: 96%    Last Pain:  Vitals:   08/23/17 0602  TempSrc:   PainSc: 2          Complications: No apparent anesthesia complications

## 2017-08-23 NOTE — H&P (Signed)
Office Visit Report     08/16/2017     --------------------------------------------------------------------------------     Carlos Potts   MRN: [id number]  PRIMARY CARE:  Carlos NielsenNatalie Alexander, DO   DOB: 07-Nov-1965, 51 year old Male  REFERRING:  Carlos Marcusourtney Horton, MD   SSN: -[id number]  PROVIDER:  Heloise PurpuraLester Ugonna Potts, M.D.     LOCATION:  [facility] [phone]     --------------------------------------------------------------------------------     CC/HPI: Left ureteral calculus     [name] returns today after having undergone shockwave lithotripsy for treatment of his ureteral stone on Monday. He states that he has had persistent left-sided pain that was quite severe yesterday although this has significantly improved today. He is asymptomatic currently and has not required any pain medication today. He was planning on [place] to [place] today for Christmas with his wife to visit extended family.        ALLERGIES: None     MEDICATIONS: Oxycodone-Acetaminophen 5 mg-325 mg tablet 1-2 tablet PO Q 6 H prn   Tamsulosin Hcl 0.4 mg capsule   Cyclobenzaprine Hcl   Ibuprofen        GU PSH: ESWL - 08/12/2017       NON-GU PSH: Tonsillectomy       GU PMH: Ureteral calculus - 08/07/2017       NON-GU PMH: Arthritis       FAMILY HISTORY: None     SOCIAL HISTORY: Marital Status: Married  Preferred Language: English; Ethnicity: Not Hispanic Or Latino; Race: White  Current Smoking Status: Patient has never smoked.     Tobacco Use Assessment Completed: Used Tobacco in last 30 days?  Does not drink anymore.   Drinks 1 caffeinated drink per day.       REVIEW OF SYSTEMS:     GU Review Male:   Patient denies frequent urination, hard to postpone urination, burning/ pain with urination, get up at night to urinate, leakage of urine, stream starts and stops, trouble starting your streams, and have to strain to urinate .   Gastrointestinal (Lower):   Patient reports  diarrhea. Patient denies constipation.   Gastrointestinal (Upper):   Patient denies nausea and vomiting.   Constitutional:   Patient denies fever, night sweats, weight loss, and fatigue.   Skin:   Patient denies skin rash/ lesion and itching.   Eyes:   Patient denies blurred vision and double vision.   Ears/ Nose/ Throat:   Patient denies sore throat and sinus problems.   Hematologic/Lymphatic:   Patient denies swollen glands and easy bruising.   Cardiovascular:   Patient denies leg swelling and chest pains.   Respiratory:   Patient denies cough and shortness of breath.   Endocrine:   Patient denies excessive thirst.   Musculoskeletal:   Patient denies back pain and joint pain.   Neurological:   Patient denies headaches and dizziness.   Psychologic:   Patient denies depression and anxiety.     VITAL SIGNS:       08/16/2017 11:56 AM   Weight 208 lb / 94.35 kg   BP 121/78 mmHg   Pulse 73 /min     MULTI-SYSTEM PHYSICAL EXAMINATION:     Constitutional: Well-nourished. No physical deformities. Normally developed. Good grooming.   Respiratory: No labored breathing, no use of accessory muscles.    Cardiovascular: Normal temperature, normal extremity pulses, no swelling, no varicosities.   Gastrointestinal: No mass, no tenderness, no rigidity, non obese abdomen. No CVA tenderness.  PAST DATA REVIEWED:   Source Of History:  Patient   X-Ray Review: KUB: Reviewed Films. I independently reviewed his KUB x-ray today. This demonstrates an 8 mm calcification that appears to be located in the mid ureter.       PROCEDURES:          KUB - [id number]   A single view of the abdomen is obtained.                     Urinalysis w/Scope - 81001  Dipstick Dipstick Cont'd Micro   Color: Yellow Bilirubin: Neg WBC/hpf: NS (Not Seen)   Appearance: Clear Ketones: 1+ RBC/hpf: 3 - 10/hpf   Specific Gravity: 1.020 Blood: 1+ Bacteria: NS (Not Seen)   pH: 6.0 Protein: Trace Cystals: NS (Not Seen)    Glucose: Neg Urobilinogen: 0.2 Casts: NS (Not Seen)     Nitrites: Neg Trichomonas: Not Present     Leukocyte Esterase: Neg Mucous: Not Present       Epithelial Cells: 0 - 5/hpf       Yeast: NS (Not Seen)       Sperm: Not Present       Notes:          ASSESSMENT:       ICD-10 Details   1 GU:   Ureteral calculus - N20.1      PLAN:               Medications  New Meds: Oxycodone-Acetaminophen 5 mg-325 mg tablet 1-2 tablet PO Q 6 H prn   #25  0 Refill(s)                Orders  X-Rays: KUB   X-Ray Notes: Marland Kitchen..             Schedule  Return Visit/Planned Activity: Other See Visit Notes              Note: Will call to schedule surgery.             Document  Letter(s):  Created for Patient: Clinical Summary            Notes:   1. Left ureteral calculus: His stone appears relatively unchanged on imaging today. Fortunately, his pain is improved. We reviewed options including continued expectant management versus intervention. Currently, he has no absolute indications to proceed with intervention. However, I did offer them the operative immediately undergo left ureteral stent placement or left ureteroscopy with laser lithotripsy this evening. Ultimately, he wishes to forego treatment currently and we'll continue to strain his urine. He has been provided a new prescription for Percocet today. He does plan to travel to [place] and understands that he may need to present to the emergency department if he develops uncontrolled pain, fever, or persistent nausea/vomiting.     He understands that he is imaging today would suggest that it would be less likely for him to spontaneously pass the stone considering the minimal change noted on his x-ray. We therefore discussed the option of scheduling him for elective treatment of his stone with ureteroscopy and laser lithotripsy. He did wish to pursue this and would like to schedule this upon return from his trip. He understands that I am not in the  office toward the end of next week although he is amenable to having this procedure performed by one of my partners.     I will see if there is an available surgeon to perform his procedure  next Thursday or Friday.     Cc: Dr. Sunnie NielsenNatalie Potts       * Signed by Carlos PurpuraLester Gradie Butrick, M.D. on 08/16/17 at 8:03 PM (EST)*

## 2017-08-23 NOTE — Anesthesia Postprocedure Evaluation (Signed)
Anesthesia Post Note  Patient: Whitney Postobert Pol  Procedure(s) Performed: CYSTOSCOPY/RETROGRADE/URETEROSCOPY/HOLMIUM LASER/STENT PLACEMENT (Left )     Patient location during evaluation: PACU Anesthesia Type: General Level of consciousness: awake and alert, patient cooperative and oriented Pain management: pain level controlled Vital Signs Assessment: post-procedure vital signs reviewed and stable Respiratory status: nonlabored ventilation, respiratory function stable and spontaneous breathing Cardiovascular status: blood pressure returned to baseline and stable Postop Assessment: no apparent nausea or vomiting Anesthetic complications: no    Last Vitals:  Vitals:   08/23/17 0930 08/23/17 1119  BP: (!) 164/92 127/78  Pulse: 76 79  Resp: 18 16  Temp: 36.6 C 36.7 C  SpO2: 96% 98%    Last Pain:  Vitals:   08/23/17 1119  TempSrc: Oral  PainSc: 1                  Mechille Varghese,E. Toa Mia

## 2017-08-24 ENCOUNTER — Encounter (HOSPITAL_COMMUNITY): Payer: Self-pay | Admitting: Urology

## 2017-09-10 ENCOUNTER — Ambulatory Visit: Payer: PRIVATE HEALTH INSURANCE | Admitting: Osteopathic Medicine

## 2017-09-10 ENCOUNTER — Encounter: Payer: Self-pay | Admitting: Osteopathic Medicine

## 2017-09-10 VITALS — BP 125/74 | HR 73 | Temp 97.7°F | Wt 212.0 lb

## 2017-09-10 DIAGNOSIS — H9313 Tinnitus, bilateral: Secondary | ICD-10-CM | POA: Insufficient documentation

## 2017-09-10 DIAGNOSIS — G4700 Insomnia, unspecified: Secondary | ICD-10-CM | POA: Insufficient documentation

## 2017-09-10 MED ORDER — TEMAZEPAM 15 MG PO CAPS: 15.0000 mg | ORAL_CAPSULE | Freq: Every evening | ORAL | 0 refills | Status: DC | PRN

## 2017-09-10 NOTE — Progress Notes (Signed)
HPI: Carlos Potts is a 52 y.o. male who  has a past medical history of Arthritis, Back ache, GERD (gastroesophageal reflux disease), History of kidney stones, Seasonal allergies, and Tinnitus.  he presents to [facility] today, 09/10/17,  for chief complaint of:  Chief Complaint  Patient presents with  . Tinnitus  . Insomnia    Tinnitus has been getting worse, has been affecting his sleep, only getting a few hours per night of sleep. Not worse on once side or another. Has been masking it with TV on for background noise. Has noted some increased stress with father's illness recently. No medication changes. Previously evaluated by ENT for similar, that office is now closed.     Past medical, surgical, social and family history reviewed:  Patient Active Problem List   Diagnosis Date Noted  . Kidney stone   . Chalazion left lower eyelid 07/27/2016  . Osteoarthritis of both hands 07/27/2016  . Abnormal thyroid blood test 04/27/2016  . Rhinitis, allergic 03/28/2016  . Bilateral low back pain without sciatica 03/28/2016  . Adult BMI > 30 03/28/2016    Past Surgical History:  Procedure Laterality Date  . CYSTOSCOPY/URETEROSCOPY/HOLMIUM LASER/STENT PLACEMENT Left 08/23/2017   Procedure: CYSTOSCOPY/RETROGRADE/URETEROSCOPY/HOLMIUM LASER/STENT PLACEMENT;  Surgeon: Heloise Purpura, MD;  Location: [facility];  Service: Urology;  Laterality: Left;  . EXTRACORPOREAL SHOCK WAVE LITHOTRIPSY Left 08/12/2017   Procedure: LEFT EXTRACORPOREAL SHOCK WAVE LITHOTRIPSY (ESWL);  Surgeon: Sebastian Ache, MD;  Location: [facility];  Service: Urology;  Laterality: Left;  . STENT TO LEFT KINEY  08/17/2017   IN [place]  . TONSILLECTOMY  AGE 70    Social History   Tobacco Use  . Smoking status: Never Smoker  . Smokeless tobacco: Never Used  Substance Use Topics  . Alcohol use: No    Family History  Problem Relation Age of Onset  . Cancer Father   . Cancer  Sister      Current medication list and allergy/intolerance information reviewed:    Current Outpatient Medications  Medication Sig Dispense Refill  . cyclobenzaprine (FLEXERIL) 5 MG tablet Take 1 tablet (5 mg total) by mouth 3 (three) times daily as needed for muscle spasms. 30 tablet 0  . ibuprofen (ADVIL,MOTRIN) 200 MG tablet Take 400-600 mg by mouth every 6 (six) hours as needed for headache, mild pain or moderate pain.    Marland Kitchen ondansetron (ZOFRAN ODT) 4 MG disintegrating tablet Take 1 tablet (4 mg total) by mouth every 8 (eight) hours as needed for nausea or vomiting. 20 tablet 0  . oxyCODONE-acetaminophen (PERCOCET/ROXICET) 5-325 MG tablet Take 1-2 tablets by mouth every 6 (six) hours as needed for moderate pain or severe pain. After lithotripsy 20 tablet 0  . oxyCODONE-acetaminophen (ROXICET) 5-325 MG tablet Take 1 tablet by mouth every 4 (four) hours as needed for severe pain. 20 tablet 0  . phenazopyridine (PYRIDIUM) 200 MG tablet Take 1 tablet (200 mg total) by mouth 3 (three) times daily as needed for pain (burning). 20 tablet 0  . tamsulosin (FLOMAX) 0.4 MG CAPS capsule Take 1 capsule (0.4 mg total) by mouth daily. To help pass kidney stone fragments 14 capsule 0   No current facility-administered medications for this visit.     No Known Allergies    Review of Systems:  Constitutional:  No  fever, no chills,  HEENT: No  headache, no vision change, no hearing change  Cardiac: No  chest pain,  Respiratory:  No  shortness of breath.  No  Cough  Gastrointestinal: No  abdominal pain, No  nausea  Musculoskeletal: No new myalgia/arthralgia  Neurologic: No  weakness, No  dizziness   Exam:  BP 125/74   Pulse 73   Temp 97.7 F (36.5 C) (Oral)   Wt 212 lb (96.2 kg)   BMI 32.23 kg/m   Constitutional: VS see above. General Appearance: alert, well-developed, well-nourished, NAD  Eyes: Normal lids and conjunctive, non-icteric sclera  Ears, Nose, Mouth, Throat: MMM, Normal  external inspection ears/nares/mouth/lips/gums. TM normal bilaterally. Pharynx/tonsils no erythema, no exudate. Nasal mucosa normal. Weber/RInne normal.   Neck: No masses, trachea midline. No thyroid enlargement.  Respiratory: Normal respiratory effort.   Musculoskeletal: Gait normal.   Neurological: Normal balance/coordination. No tremor. No cranial nerve deficit on limited exam. Motor intact and symmetric. Cerebellar reflexes intact.   Skin: warm, dry, intact. No rash/ulcer.    Psychiatric: Normal judgment/insight. Normal mood and affect. Oriented x3.     ASSESSMENT/PLAN:   Tinnitus of both ears - Plan: Ambulatory referral to ENT  Insomnia, unspecified type - Secondary to tinnitus, will trial benzodiazepine as this has shown some efficacy for tinnitus. Advised on risk of dependence, take only prn. Other info printed - Plan: temazepam (RESTORIL) 15 MG capsule     Visit summary with medication list and pertinent instructions was printed for patient to review. All questions at time of visit were answered - patient instructed to contact office with any additional concerns. ER/RTC precautions were reviewed with the patient.   Follow-up plan: Return if symptoms worsen or fail to improve, otherwise will see what ENT recommends .  Note: Total time spent 25 minutes, greater than 50% of the visit was spent face-to-face counseling and coordinating care for the following: The primary encounter diagnosis was Tinnitus of both ears. A diagnosis of Insomnia, unspecified type was also pertinent to this visit.Marland Kitchen.  Please note: voice recognition software was used to produce this document, and typos may escape review. Please contact Dr. Lyn HollingsheadAlexander for any needed clarifications.

## 2017-09-20 DIAGNOSIS — H903 Sensorineural hearing loss, bilateral: Secondary | ICD-10-CM | POA: Insufficient documentation

## 2017-09-27 ENCOUNTER — Other Ambulatory Visit: Payer: Self-pay | Admitting: Osteopathic Medicine

## 2017-09-27 DIAGNOSIS — G4700 Insomnia, unspecified: Secondary | ICD-10-CM

## 2017-09-27 NOTE — Telephone Encounter (Signed)
Pharmacy requesting refill for medication. Pls advise if appropriate, thanks.

## 2017-09-30 NOTE — Telephone Encounter (Signed)
As per pt - had an appt w/ENT on 09/18/17. Was informed that pt does have some hearing loss & not much can be done for tinnitus. Pt was told to use some distraction techniques such as using a fan, white noise machine while sleeping. Pt states medication has helped him to sleep better & is not taking med every night as informed by pcp.

## 2017-09-30 NOTE — Telephone Encounter (Signed)
Will approve this refill for now (Clinical note: he is taking it for sleep issues due to tinnitus / ear ringing but discouraged from using this medication daily due to risk of dependence)   He was referred to ENT for the hearing issue - please conform with the patient whether or not he has heard back from anyone at ENT about an appointment?

## 2017-10-12 ENCOUNTER — Other Ambulatory Visit: Payer: Self-pay | Admitting: Osteopathic Medicine

## 2017-10-12 DIAGNOSIS — G4700 Insomnia, unspecified: Secondary | ICD-10-CM

## 2017-10-14 ENCOUNTER — Other Ambulatory Visit: Payer: Self-pay

## 2017-10-14 DIAGNOSIS — G4700 Insomnia, unspecified: Secondary | ICD-10-CM

## 2017-10-14 MED ORDER — TEMAZEPAM 15 MG PO CAPS: ORAL_CAPSULE | ORAL | 0 refills | Status: DC

## 2017-11-14 ENCOUNTER — Telehealth: Payer: Self-pay

## 2017-11-14 DIAGNOSIS — G4700 Insomnia, unspecified: Secondary | ICD-10-CM

## 2017-11-14 NOTE — Telephone Encounter (Signed)
PT needs refill on sleeping medication. PT states he can not sleep without it.  temazepam (RESTORIL) 15 MG capsule

## 2017-11-15 MED ORDER — TEMAZEPAM 15 MG PO CAPS: ORAL_CAPSULE | ORAL | 0 refills | Status: DC

## 2017-11-15 NOTE — Telephone Encounter (Signed)
Routing for review

## 2017-11-15 NOTE — Telephone Encounter (Signed)
Please route to provider in office - I don't have a problem refilling as long as PMP ok bury I'm unable to send Rx while out of office.   He and I will discuss as usual at next visit recommend coming off this medicine eventually

## 2017-11-15 NOTE — Telephone Encounter (Signed)
Will forward request to pcp for review

## 2017-11-15 NOTE — Telephone Encounter (Signed)
Left VM with status update.  

## 2017-11-15 NOTE — Telephone Encounter (Signed)
[facility] is out of stock for the temazepam. Printed Rx provided from Provider in office for Pt to take to [facility]

## 2017-11-15 NOTE — Telephone Encounter (Signed)
I am the fill in provider. Temazepam refilled for 30 tablets.  Further refills should go to Dr. Lyn HollingsheadAlexander.  She may be able to provide more pills with more refills as she is currently managing these problems.

## 2018-09-12 ENCOUNTER — Emergency Department (INDEPENDENT_AMBULATORY_CARE_PROVIDER_SITE_OTHER)
Admission: EM | Admit: 2018-09-12 | Discharge: 2018-09-12 | Disposition: A | Discharge status: Home / Self Care | Payer: PRIVATE HEALTH INSURANCE | Source: Home / Self Care | Attending: Emergency Medicine | Admitting: Emergency Medicine

## 2018-09-12 ENCOUNTER — Emergency Department (INDEPENDENT_AMBULATORY_CARE_PROVIDER_SITE_OTHER): Payer: PRIVATE HEALTH INSURANCE

## 2018-09-12 ENCOUNTER — Encounter: Payer: Self-pay | Admitting: *Deleted

## 2018-09-12 ENCOUNTER — Other Ambulatory Visit: Payer: Self-pay

## 2018-09-12 DIAGNOSIS — R079 Chest pain, unspecified: Secondary | ICD-10-CM | POA: Diagnosis not present

## 2018-09-12 DIAGNOSIS — R202 Paresthesia of skin: Secondary | ICD-10-CM

## 2018-09-12 MED ORDER — LIDOCAINE 5 % EX PTCH: 1.0000 | MEDICATED_PATCH | CUTANEOUS | 0 refills | Status: DC

## 2018-09-12 MED ORDER — VALACYCLOVIR HCL 1 G PO TABS: 1000.0000 mg | ORAL_TABLET | Freq: Three times a day (TID) | ORAL | 0 refills | Status: DC

## 2018-09-12 NOTE — Discharge Instructions (Signed)
We are treating you as if this is shingles without a rash. You will be on Valtrex and Lidoderm. Please make an appointment for follow-up with your primary care physician for next week.

## 2018-09-12 NOTE — ED Provider Notes (Signed)
[facility]    CSN: [id number] Arrival date & time: 09/12/18  0801     History   Chief Complaint Chief Complaint  Patient presents with  . Numbness    HPI Carlos Potts is a 53 y.o. male.   HPI Patient presents with a one-week history of pain and numbness on his left flank area.  This discomfort extends over to the left side of the sternum.  It does not increase with any type of movement.  He denies any injury to his back.  He has not seen a rash in the area.  He has not had the shingles vaccine.  He has no respiratory symptoms or abdominal complaints. Past Medical History:  Diagnosis Date  . Arthritis    FINGERS  . Back ache   . GERD (gastroesophageal reflux disease)   . History of kidney stones   . Seasonal allergies   . Tinnitus     Patient Active Problem List   Diagnosis Date Noted  . Tinnitus of both ears 09/10/2017  . Insomnia 09/10/2017  . Kidney stone   . Chalazion left lower eyelid 07/27/2016  . Osteoarthritis of both hands 07/27/2016  . Abnormal thyroid blood test 04/27/2016  . Rhinitis, allergic 03/28/2016  . Bilateral low back pain without sciatica 03/28/2016  . Adult BMI > 30 03/28/2016    Past Surgical History:  Procedure Laterality Date  . CYSTOSCOPY/URETEROSCOPY/HOLMIUM LASER/STENT PLACEMENT Left 08/23/2017   Procedure: CYSTOSCOPY/RETROGRADE/URETEROSCOPY/HOLMIUM LASER/STENT PLACEMENT;  Surgeon: Heloise PurpuraBorden, Lester, MD;  Location: [facility];  Service: Urology;  Laterality: Left;  . EXTRACORPOREAL SHOCK WAVE LITHOTRIPSY Left 08/12/2017   Procedure: LEFT EXTRACORPOREAL SHOCK WAVE LITHOTRIPSY (ESWL);  Surgeon: Sebastian AcheManny, Theodore, MD;  Location: [facility];  Service: Urology;  Laterality: Left;  . STENT TO LEFT KINEY  08/17/2017   IN [place]  . TONSILLECTOMY  AGE 76       Home Medications    Prior to Admission medications   Medication Sig Start Date End Date Taking? Authorizing Provider  cyclobenzaprine (FLEXERIL) 5 MG tablet Take 1  tablet (5 mg total) by mouth 3 (three) times daily as needed for muscle spasms. 05/08/17   Sunnie NielsenAlexander, Natalie, DO  ibuprofen (ADVIL,MOTRIN) 200 MG tablet Take 400-600 mg by mouth every 6 (six) hours as needed for headache, mild pain or moderate pain.    [provider]  lidocaine (LIDODERM) 5 % Place 1 patch onto the skin daily. Remove & Discard patch within 12 hours or as directed by MD 09/12/18   Collene Gobbleaub,  A, MD  temazepam (RESTORIL) 15 MG capsule TAKE 1 CAPSULE(15 MG) BY MOUTH AT BEDTIME AS NEEDED FOR SLEEP 11/15/17   Monica Bectonhekkekandam, Thomas J, MD  valACYclovir (VALTREX) 1000 MG tablet Take 1 tablet (1,000 mg total) by mouth 3 (three) times daily for 14 days. 09/12/18 09/26/18  Collene Gobbleaub,  A, MD    Family History Family History  Problem Relation Age of Onset  . Cancer Father   . Cancer Sister     Social History Social History   Tobacco Use  . Smoking status: Never Smoker  . Smokeless tobacco: Never Used  Substance Use Topics  . Alcohol use: No  . Drug use: No     Allergies   Patient has no known allergies.   Review of Systems Review of Systems  Constitutional: Negative.   HENT: Negative.   Respiratory: Negative.   Gastrointestinal: Negative.   Musculoskeletal:       Positive for pain left side of his  back in the left flank area.  Neurological: Negative.      Physical Exam Triage Vital Signs ED Triage Vitals  Enc Vitals Group     BP 09/12/18 0819 (!) 145/85     Pulse Rate 09/12/18 0819 82     Resp 09/12/18 0819 14     Temp 09/12/18 0819 (!) 97.5 F (36.4 C)     Temp Source 09/12/18 0819 Oral     SpO2 09/12/18 0819 95 %     Weight 09/12/18 0821 215 lb (97.5 kg)     Height 09/12/18 0821 5\' 8"  (1.727 m)     Head Circumference --      Peak Flow --      Pain Score 09/12/18 0820 7     Pain Loc --      Pain Edu? --      Excl. in GC? --    No data found.  Updated Vital Signs BP (!) 145/85 (BP Location: Right Arm)   Pulse 82   Temp (!) 97.5 F (36.4  C) (Oral)   Resp 14   Ht 5\' 8"  (1.727 m)   Wt 97.5 kg   SpO2 96%   BMI 32.69 kg/m   Visual Acuity Right Eye Distance:   Left Eye Distance:   Bilateral Distance:    Right Eye Near:   Left Eye Near:    Bilateral Near:     Physical Exam Constitutional:      Appearance: Normal appearance.  HENT:     Head: Normocephalic.  Neck:     Musculoskeletal: Normal range of motion.  Cardiovascular:     Rate and Rhythm: Normal rate and regular rhythm.  Pulmonary:     Effort: Pulmonary effort is normal.     Breath sounds: Normal breath sounds.  Abdominal:     General: Abdomen is flat.     Palpations: Abdomen is soft.  Musculoskeletal:     Comments: There is tenderness to palpation over the left flank area extending to the left sternal margin at the costosternal junction.  Skin:    Comments: There is a redness in the T6 area on the left without definite blisters or papules.  Neurological:     General: No focal deficit present.     Mental Status: He is alert and oriented to person, place, and time.  Psychiatric:        Mood and Affect: Mood normal.      UC Treatments / Results  Labs (all labs ordered are listed, but only abnormal results are displayed) Labs Reviewed - No data to display  EKG None  Radiology Dg Chest 2 View  Result Date: 09/12/2018 CLINICAL DATA:  Chest pain EXAM: CHEST - 2 VIEW COMPARISON:  None. FINDINGS: Lungs are clear. Heart size and pulmonary vascularity are normal. No adenopathy. No pneumothorax. No bone lesions. IMPRESSION: No edema or consolidation. Electronically Signed   By: Bretta BangWilliam  Woodruff III M.D.   On: 09/12/2018 09:03    Procedures Procedures (including critical care time)  Medications Ordered in UC Medications - No data to display  Initial Impression / Assessment and Plan / UC Course  I have reviewed the triage vital signs and the nursing notes.  Pertinent labs & imaging results that were available during my care of the patient were  reviewed by me and considered in my medical decision making (see chart for details). History most consistent with shingles.  Will treat as shingles for now and watch for the rash.  He will be on Valtrex 3 times a day for 7 days as well as a Lidoderm patch.  Chest x-ray was done because of a pulse ox of 95/96 and this was normal.     Final Clinical Impressions(s) / UC Diagnoses   Final diagnoses:  Paresthesia     Discharge Instructions     We are treating you as if this is shingles without a rash. You will be on Valtrex and Lidoderm. Please make an appointment for follow-up with your primary care physician for next week.    ED Prescriptions    Medication Sig Dispense Auth. Provider   valACYclovir (VALTREX) 1000 MG tablet Take 1 tablet (1,000 mg total) by mouth 3 (three) times daily for 14 days. 21 tablet Collene Gobble, MD   lidocaine (LIDODERM) 5 % Place 1 patch onto the skin daily. Remove & Discard patch within 12 hours or as directed by MD 30 patch , Maylon Peppers, MD     Controlled Substance Prescriptions [place] Controlled Substance Registry consulted? Not Applicable   Collene Gobble, MD 09/12/18 1324

## 2018-09-12 NOTE — ED Triage Notes (Signed)
Patient c/o numbness and pain intermittent to left mid back and left lower chest/upper abdomen x 6 days. No visible rash.

## 2018-09-16 ENCOUNTER — Telehealth: Payer: Self-pay | Admitting: Emergency Medicine

## 2018-09-16 NOTE — Telephone Encounter (Signed)
Call from pharmacy; they dispensed 21 tablets of Valtrex 1000 mg per order but then realized it should have been 42 tablets; they would like rx to cover the remainder of the desired dose. pk

## 2018-09-17 ENCOUNTER — Ambulatory Visit: Payer: PRIVATE HEALTH INSURANCE | Admitting: Osteopathic Medicine

## 2018-09-19 ENCOUNTER — Ambulatory Visit (INDEPENDENT_AMBULATORY_CARE_PROVIDER_SITE_OTHER): Payer: PRIVATE HEALTH INSURANCE | Admitting: Osteopathic Medicine

## 2018-09-19 ENCOUNTER — Encounter: Payer: Self-pay | Admitting: Osteopathic Medicine

## 2018-09-19 VITALS — BP 139/88 | HR 73 | Temp 97.9°F | Wt 214.7 lb

## 2018-09-19 DIAGNOSIS — B029 Zoster without complications: Secondary | ICD-10-CM

## 2018-09-19 DIAGNOSIS — G4700 Insomnia, unspecified: Secondary | ICD-10-CM

## 2018-09-19 MED ORDER — OXYCODONE-ACETAMINOPHEN 5-325 MG PO TABS: 1.0000 | ORAL_TABLET | Freq: Four times a day (QID) | ORAL | 0 refills | Status: DC | PRN

## 2018-09-19 MED ORDER — GABAPENTIN 100 MG PO CAPS: 100.0000 mg | ORAL_CAPSULE | Freq: Three times a day (TID) | ORAL | 1 refills | Status: DC | PRN

## 2018-09-19 MED ORDER — VALACYCLOVIR HCL 1 G PO TABS: 1000.0000 mg | ORAL_TABLET | Freq: Three times a day (TID) | ORAL | 0 refills | Status: AC

## 2018-09-19 NOTE — Patient Instructions (Addendum)
Will refill the oxycodone to take as needed -  sparing use to avoid dependence/tolerance.  We will also try the gabapentin - can start with 100 mg 3 times a day on the first day, if this is helping can continue, otherwise can increase to 200 mg 3 times a day for 1 day, then 300 mg 3 times a day.  If this is helping, let me know and I can send a prescription for 300 mg pills, or we can increase the dose more from there if needed.  For sleep -  Okay to continue the temazepam, may try taking it a bit earlier in the evening.  Can also try taking melatonin around dinnertime or after that, Benadryl 50 mg before bedtime, can take all of these things together.  Please message me or call if there are any concerns or if the medications do not seem to be working for you.  We may not be able to get your pain to 0 but we will try to get you functional as the illness resolves.  Plan to recheck in the office in another 4 weeks or so to see how you are recovering, please see me sooner if needed.

## 2018-09-19 NOTE — Progress Notes (Signed)
HPI: Carlos Potts is a 53 y.o. male who  has a past medical history of Arthritis, Back ache, GERD (gastroesophageal reflux disease), History of kidney stones, Seasonal allergies, and Tinnitus.  he presents to [facility] today, 09/19/18,  for chief complaint of:  Urgent care f/u - shingles  . Seen in urgent care 1 week ago 09/12/2018 for pain of skin on L back and flank without rash, possible shingles.  Was started on Valtrex 3 times daily x7 days with Lidoderm patch.  Advised to follow-up with primary care in 1 week. Symptoms ongoing at this point for about 2 weeks. Marland Kitchen Has been taking some leftover oxycodone for pain relief, which is helping, he is currently requesting a refill for the Valtrex.  Still no development of rash but pain has persisted.  Pain described as burning/numbness, occasionally quite sharp, mostly superficial but occasionally feels like it shoots a little deeper.  No pain with arm or torso movement or breathing, hurts to lie down on that side. . Difficulty sleeping, would like to know if he could possibly increase the dose of temazepam.  Sleep onset is greatest issue at this point.  Temazepam is helping him sleep through the night but he is still occasionally waking up with pain from the shingles.        At today's visit 09/19/18 ... PMH, PSH, FH reviewed and updated as needed.  Current medication list and allergy/intolerance hx reviewed and updated as needed. (See remainder of HPI, ROS, Phys Exam below)        ASSESSMENT/PLAN: The primary encounter diagnosis was Herpes zoster without complication. A diagnosis of Insomnia, unspecified type was also pertinent to this visit.    Meds ordered this encounter  Medications  . oxyCODONE-acetaminophen (PERCOCET) 5-325 MG tablet    Sig: Take 1-2 tablets by mouth every 6 (six) hours as needed for severe pain. Use sparingly to avoid tolerance/dependence    Dispense:  30 tablet     Refill:  0  . gabapentin (NEURONTIN) 100 MG capsule    Sig: Take 1-3 capsules (100-300 mg total) by mouth 3 (three) times daily as needed (shingles pain).    Dispense:  90 capsule    Refill:  1  . valACYclovir (VALTREX) 1000 MG tablet    Sig: Take 1 tablet (1,000 mg total) by mouth 3 (three) times daily for 7 days.    Dispense:  21 tablet    Refill:  0    Patient Instructions  Will refill the oxycodone to take as needed -  sparing use to avoid dependence/tolerance.  We will also try the gabapentin - can start with 100 mg 3 times a day on the first day, if this is helping can continue, otherwise can increase to 200 mg 3 times a day for 1 day, then 300 mg 3 times a day.  If this is helping, let me know and I can send a prescription for 300 mg pills, or we can increase the dose more from there if needed.  For sleep -  Okay to continue the temazepam, may try taking it a bit earlier in the evening.  Can also try taking melatonin around dinnertime or after that, Benadryl 50 mg before bedtime, can take all of these things together.  Please message me or call if there are any concerns or if the medications do not seem to be working for you.  We may not be able to get your pain to 0 but  we will try to get you functional as the illness resolves.  Plan to recheck in the office in another 4 weeks or so to see how you are recovering, please see me sooner if needed.            Follow-up plan: Return in about 4 weeks (around 10/17/2018) for Check shingles pain if needed.                                                 ################################################# ################################################# ################################################# #################################################    Current Meds  Medication Sig  . cyclobenzaprine (FLEXERIL) 5 MG tablet Take 1 tablet (5 mg total) by mouth 3 (three) times daily  as needed for muscle spasms.  Marland Kitchen ibuprofen (ADVIL,MOTRIN) 200 MG tablet Take 400-600 mg by mouth every 6 (six) hours as needed for headache, mild pain or moderate pain.  Marland Kitchen lidocaine (LIDODERM) 5 % Place 1 patch onto the skin daily. Remove & Discard patch within 12 hours or as directed by MD  . temazepam (RESTORIL) 15 MG capsule TAKE 1 CAPSULE(15 MG) BY MOUTH AT BEDTIME AS NEEDED FOR SLEEP  . valACYclovir (VALTREX) 1000 MG tablet Take 1 tablet (1,000 mg total) by mouth 3 (three) times daily for 7 days.  . [DISCONTINUED] valACYclovir (VALTREX) 1000 MG tablet Take 1 tablet (1,000 mg total) by mouth 3 (three) times daily for 14 days.    No Known Allergies     Review of Systems:  Constitutional: No recent illness except as noted above, no fever/chills  Cardiac: No  chest pain  Respiratory:  No  shortness of breath.   Gastrointestinal: No  abdominal pain, no change on bowel habits  Musculoskeletal: No new myalgia/arthralgia  Skin: No  Rash, +pain as per HPI  Hem/Onc: No  easy bruising/bleeding, No  abnormal lumps/bumps  Neurologic: No  weakness, No  Dizziness   Exam:  BP 139/88 (BP Location: Left Arm, Patient Position: Sitting, Cuff Size: Normal)   Pulse 73   Temp 97.9 F (36.6 C) (Oral)   Wt 214 lb 11.2 oz (97.4 kg)   BMI 32.65 kg/m   Constitutional: VS see above. General Appearance: alert, well-developed, well-nourished, NAD  Eyes: Normal lids and conjunctive, non-icteric sclera  Ears, Nose, Mouth, Throat: MMM, Normal external inspection ears/nares/mouth/lips/gums.  Neck: No masses, trachea midline.   Respiratory: Normal respiratory effort.   Musculoskeletal: Gait normal. Symmetric and independent movement of all extremities  Abdominal: non-tender, non-distended, no appreciable organomegaly, neg Murphy's, BS [id number]  Neurological: Normal balance/coordination. No tremor.  Skin: warm, dry, intact. No rash in area of pain on L mid-back wrapping to L chest, pt reports  tenderness to touch in that area   Psychiatric: Normal judgment/insight. Normal mood and affect. Oriented x3.       Visit summary with medication list and pertinent instructions was printed for patient to review, patient was advised to alert [place] if any updates are needed. All questions at time of visit were answered - patient instructed to contact office with any additional concerns. ER/RTC precautions were reviewed with the patient and understanding verbalized.     Please note: voice recognition software was used to produce this document, and typos may escape review. Please contact Dr. Lyn Hollingshead for any needed clarifications.    Follow up plan: Return in about 4 weeks (around 10/17/2018) for Check shingles pain if needed.

## 2018-10-02 ENCOUNTER — Other Ambulatory Visit: Payer: Self-pay

## 2018-10-02 DIAGNOSIS — Z0000 Encounter for general adult medical examination without abnormal findings: Secondary | ICD-10-CM

## 2018-10-02 MED ORDER — GABAPENTIN 100 MG PO CAPS: 100.0000 mg | ORAL_CAPSULE | Freq: Three times a day (TID) | ORAL | 1 refills | Status: DC | PRN

## 2018-10-02 NOTE — Telephone Encounter (Addendum)
1. Pt came into office today stating he has run out of Gabapentin for his Shingles and is wanting a RF. Pt states he is waking up at night with pain still.   RX pended for refill, please send if appropriate   2. Pt has physical scheduled for 10-24-18, can you please enter lab orders for pt?

## 2018-10-02 NOTE — Telephone Encounter (Signed)
Done and done

## 2018-10-03 NOTE — Telephone Encounter (Signed)
Pt advised.

## 2018-10-11 LAB — COMPLETE METABOLIC PANEL WITH GFR
AG RATIO: 1.9 (calc) (ref 1.0–2.5)
ALT: 45 U/L (ref 9–46)
AST: 28 U/L (ref 10–35)
Albumin: 4.6 g/dL (ref 3.6–5.1)
Alkaline phosphatase (APISO): 74 U/L (ref 35–144)
BUN: 19 mg/dL (ref 7–25)
CO2: 26 mmol/L (ref 20–32)
Calcium: 9.4 mg/dL (ref 8.6–10.3)
Chloride: 103 mmol/L (ref 98–110)
Creat: 0.97 mg/dL (ref 0.70–1.33)
GFR, EST AFRICAN AMERICAN: 102 mL/min/{1.73_m2} (ref >=60)
GFR, EST NON AFRICAN AMERICAN: 88 mL/min/{1.73_m2} (ref >=60)
Globulin: 2.4 g/dL (calc) (ref 1.9–3.7)
Glucose, Bld: 79 mg/dL (ref 65–99)
Potassium: 4.2 mmol/L (ref 3.5–5.3)
Sodium: 139 mmol/L (ref 135–146)
Total Bilirubin: 0.7 mg/dL (ref 0.2–1.2)
Total Protein: 7 g/dL (ref 6.1–8.1)

## 2018-10-11 LAB — CBC
HCT: 43.2 % (ref 38.5–50.0)
Hemoglobin: 14.7 g/dL (ref 13.2–17.1)
MCH: 30.4 pg (ref 27.0–33.0)
MCHC: 34 g/dL (ref 32.0–36.0)
MCV: 89.4 fL (ref 80.0–100.0)
MPV: 11.4 fL (ref 7.5–12.5)
Platelets: 229 10*3/uL (ref 140–400)
RBC: 4.83 10*6/uL (ref 4.20–5.80)
RDW: 13.7 % (ref 11.0–15.0)
WBC: 6.9 10*3/uL (ref 3.8–10.8)

## 2018-10-11 LAB — LIPID PANEL
CHOL/HDL RATIO: 3.9 (calc) (ref <5.0)
Cholesterol: 159 mg/dL (ref <200)
HDL: 41 mg/dL (ref >=40)
LDL CHOLESTEROL (CALC): 97 mg/dL
NON-HDL CHOLESTEROL (CALC): 118 mg/dL (ref <130)
Triglycerides: 110 mg/dL (ref <150)

## 2018-10-11 LAB — TSH: TSH: 4.08 mIU/L (ref 0.40–4.50)

## 2018-10-24 ENCOUNTER — Encounter: Payer: Self-pay | Admitting: Osteopathic Medicine

## 2018-10-24 ENCOUNTER — Ambulatory Visit (INDEPENDENT_AMBULATORY_CARE_PROVIDER_SITE_OTHER): Payer: PRIVATE HEALTH INSURANCE | Admitting: Osteopathic Medicine

## 2018-10-24 VITALS — BP 120/79 | HR 73 | Temp 97.7°F | Wt 221.3 lb

## 2018-10-24 DIAGNOSIS — Z1211 Encounter for screening for malignant neoplasm of colon: Secondary | ICD-10-CM | POA: Diagnosis not present

## 2018-10-24 DIAGNOSIS — Z0000 Encounter for general adult medical examination without abnormal findings: Secondary | ICD-10-CM | POA: Diagnosis not present

## 2018-10-24 DIAGNOSIS — B0229 Other postherpetic nervous system involvement: Secondary | ICD-10-CM | POA: Insufficient documentation

## 2018-10-24 DIAGNOSIS — Z8619 Personal history of other infectious and parasitic diseases: Secondary | ICD-10-CM | POA: Diagnosis not present

## 2018-10-24 HISTORY — DX: Personal history of other infectious and parasitic diseases: Z86.19

## 2018-10-24 MED ORDER — CYCLOBENZAPRINE HCL 5 MG PO TABS: 5.0000 mg | ORAL_TABLET | Freq: Three times a day (TID) | ORAL | 2 refills | Status: DC | PRN

## 2018-10-24 MED ORDER — GABAPENTIN 100 MG PO CAPS: 100.0000 mg | ORAL_CAPSULE | Freq: Three times a day (TID) | ORAL | 1 refills | Status: DC | PRN

## 2018-10-24 NOTE — Patient Instructions (Addendum)
General Preventive Care  Most recent routine screening lipids/other labs: already done!   Everyone should have blood pressure checked once per year.   Tobacco: don't!   Alcohol: responsible moderation is ok for most adults - if you have concerns about your alcohol intake, please talk to me!   Exercise: as tolerated to reduce risk of cardiovascular disease and diabetes. Strength training will also prevent osteoporosis.   Mental health: if need for mental health care (medicines, counseling, other), or concerns about moods, please let me know!   Sexual health: if ever a need for STD testing, or if concerns with libido/pain problems, please let me know!   Advanced Directive: Living Will and/or Healthcare Power of Attorney recommended for all adults, regardless of age or health.  Vaccines  Flu vaccine: recommended for almost everyone, every fall.   Shingles vaccine: Shingrix recommended after age 23. Can get this once symptoms resolve or after 6 months. Will put you on our list.   Pneumonia vaccines: Prevnar and Pneumovax recommended after age 44  Tetanus booster: Tdap recommended every 10 years. Done 04/2016 Cancer screenings   Colon cancer screening: recommended for everyone at age 72. Will refer to gastroenterology for colonoscopy. Call me if you decide you's prefer the Cologuard.   Prostate cancer screening: PSA blood test around age 69  Lung cancer screening: not needed for non-smokers  Infection screenings . HIV, Gonorrhea/Chlamydia: screening as needed. . Hepatitis C: recommended for anyone born 61-1967. Done 03/2016 and negative.  . TB: certain at-risk populations, or depending on work requirements and/or travel history Other . Bone Density Test: recommended for men at age 58 . Abdominal Aortic Aneurysm: screening with ultrasound recommended once for men age 85-75 who have ever smoked

## 2018-10-24 NOTE — Progress Notes (Signed)
HPI: Carlos Potts is a 53 y.o. male who  has a past medical history of Arthritis, Back ache, GERD (gastroesophageal reflux disease), History of kidney stones, History of shingles (10/24/2018), Seasonal allergies, and Tinnitus.  he presents to [facility] today, 10/24/18,  for chief complaint of: Annual physical     Patient here for annual physical / wellness exam.  See preventive care reviewed as below.  Recent labs reviewed in detail with the patient.   Additional concerns today include:  Shingles pain is a lot better but he still has some tingling and skin sensitivity on the left torso.  The gabapentin is really helping with this   Past medical, surgical, social and family history reviewed:  Patient Active Problem List   Diagnosis Date Noted  . Post herpetic neuralgia 10/24/2018  . History of shingles 10/24/2018  . Tinnitus of both ears 09/10/2017  . Insomnia 09/10/2017  . Kidney stone   . Chalazion left lower eyelid 07/27/2016  . Osteoarthritis of both hands 07/27/2016  . Abnormal thyroid blood test 04/27/2016  . Rhinitis, allergic 03/28/2016  . Bilateral low back pain without sciatica 03/28/2016  . Adult BMI > 30 03/28/2016    Past Surgical History:  Procedure Laterality Date  . CYSTOSCOPY/URETEROSCOPY/HOLMIUM LASER/STENT PLACEMENT Left 08/23/2017   Procedure: CYSTOSCOPY/RETROGRADE/URETEROSCOPY/HOLMIUM LASER/STENT PLACEMENT;  Surgeon: Heloise Purpura, MD;  Location: [facility];  Service: Urology;  Laterality: Left;  . EXTRACORPOREAL SHOCK WAVE LITHOTRIPSY Left 08/12/2017   Procedure: LEFT EXTRACORPOREAL SHOCK WAVE LITHOTRIPSY (ESWL);  Surgeon: Sebastian Ache, MD;  Location: [facility];  Service: Urology;  Laterality: Left;  . STENT TO LEFT KINEY  08/17/2017   IN [place]  . TONSILLECTOMY  AGE 49    Social History   Tobacco Use  . Smoking status: Never Smoker  . Smokeless tobacco: Never Used  Substance Use Topics  . Alcohol  use: No    Family History  Problem Relation Age of Onset  . Cancer Father   . Cancer Sister      Current medication list and allergy/intolerance information reviewed:    Current Outpatient Medications  Medication Sig Dispense Refill  . cyclobenzaprine (FLEXERIL) 5 MG tablet Take 1-2 tablets (5-10 mg total) by mouth 3 (three) times daily as needed for muscle spasms. 60 tablet 2  . gabapentin (NEURONTIN) 100 MG capsule Take 1-3 capsules (100-300 mg total) by mouth 3 (three) times daily as needed (shingles pain). 90 capsule 1  . ibuprofen (ADVIL,MOTRIN) 200 MG tablet Take 400-600 mg by mouth every 6 (six) hours as needed for headache, mild pain or moderate pain.    Marland Kitchen oxyCODONE-acetaminophen (PERCOCET) 5-325 MG tablet Take 1-2 tablets by mouth every 6 (six) hours as needed for severe pain. Use sparingly to avoid tolerance/dependence 30 tablet 0  . temazepam (RESTORIL) 15 MG capsule TAKE 1 CAPSULE(15 MG) BY MOUTH AT BEDTIME AS NEEDED FOR SLEEP 30 capsule 0   No current facility-administered medications for this visit.     No Known Allergies    Review of Systems:  Constitutional:  No  fever, no chills, No recent illness, No unintentional weight changes. No significant fatigue.   HEENT: No  headache, no vision change, no hearing change, No sore throat, No  sinus pressure  Cardiac: No  chest pain, No  pressure, No palpitations, No  Orthopnea  Respiratory:  No  shortness of breath. No  Cough  Gastrointestinal: No  abdominal pain, No  nausea, No  vomiting,  No  blood  in stool, No  diarrhea, No  constipation   Musculoskeletal: No new myalgia/arthralgia  Skin: No  Rash, No other wounds/concerning lesions  Genitourinary: No  incontinence, No  abnormal genital bleeding, No abnormal genital discharge  Hem/Onc: No  easy bruising/bleeding, No  abnormal lymph node  Endocrine: No cold intolerance,  No heat intolerance. No polyuria/polydipsia/polyphagia   Neurologic: No  weakness, No   dizziness, No  slurred speech/focal weakness/facial droop  Psychiatric: No  concerns with depression, No  concerns with anxiety, No sleep problems, No mood problems  Exam:  BP 120/79 (BP Location: Left Arm, Patient Position: Sitting, Cuff Size: Normal)   Pulse 73   Temp 97.7 F (36.5 C) (Oral)   Wt 221 lb 4.8 oz (100.4 kg)   BMI 33.65 kg/m   Constitutional: VS see above. General Appearance: alert, well-developed, well-nourished, NAD  Eyes: Normal lids and conjunctive, non-icteric sclera  Ears, Nose, Mouth, Throat: MMM, Normal external inspection ears/nares/mouth/lips/gums. TM normal bilaterally. Pharynx/tonsils no erythema, no exudate. Nasal mucosa normal.   Neck: No masses, trachea midline. No thyroid enlargement. No tenderness/mass appreciated. No lymphadenopathy  Respiratory: Normal respiratory effort. no wheeze, no rhonchi, no rales  Cardiovascular: S1/S2 normal, no murmur, no rub/gallop auscultated. RRR. No lower extremity edema. Pedal pulse II/IV bilaterally DP and PT. No carotid bruit or JVD. No abdominal aortic bruit.  Gastrointestinal: Nontender, no masses. No hepatomegaly, no splenomegaly. No hernia appreciated. Bowel sounds normal. Rectal exam deferred.   Musculoskeletal: Gait normal. No clubbing/cyanosis of digits.   Neurological: Normal balance/coordination. No tremor. No cranial nerve deficit on limited exam. Motor and sensation intact and symmetric. Cerebellar reflexes intact.   Skin: warm, dry, intact. No rash/ulcer. No concerning nevi or subq nodules on limited exam.    Psychiatric: Normal judgment/insight. Normal mood and affect. Oriented x3.      ASSESSMENT/PLAN: The primary encounter diagnosis was Annual physical exam. Diagnoses of Screen for colon cancer, Post herpetic neuralgia, and History of shingles were also pertinent to this visit.   Orders Placed This Encounter  Procedures  . Ambulatory referral to Gastroenterology    Meds ordered this  encounter  Medications  . gabapentin (NEURONTIN) 100 MG capsule    Sig: Take 1-3 capsules (100-300 mg total) by mouth 3 (three) times daily as needed (shingles pain).    Dispense:  90 capsule    Refill:  1  . cyclobenzaprine (FLEXERIL) 5 MG tablet    Sig: Take 1-2 tablets (5-10 mg total) by mouth 3 (three) times daily as needed for muscle spasms.    Dispense:  60 tablet    Refill:  2   The 10-year ASCVD risk score Denman George DC Jr., et al., 2013) is: 4.2%   Values used to calculate the score:     Age: 43 years     Sex: Male     Is Non-Hispanic African American: No     Diabetic: No     Tobacco smoker: No     Systolic Blood Pressure: 120 mmHg     Is BP treated: No     HDL Cholesterol: 41 mg/dL     Total Cholesterol: 159 mg/dL   Patient Instructions  General Preventive Care  Most recent routine screening lipids/other labs: already done!   Everyone should have blood pressure checked once per year.   Tobacco: don't!   Alcohol: responsible moderation is ok for most adults - if you have concerns about your alcohol intake, please talk to me!   Exercise: as tolerated to  reduce risk of cardiovascular disease and diabetes. Strength training will also prevent osteoporosis.   Mental health: if need for mental health care (medicines, counseling, other), or concerns about moods, please let me know!   Sexual health: if ever a need for STD testing, or if concerns with libido/pain problems, please let me know!   Advanced Directive: Living Will and/or Healthcare Power of Attorney recommended for all adults, regardless of age or health.  Vaccines  Flu vaccine: recommended for almost everyone, every fall.   Shingles vaccine: Shingrix recommended after age 48. Can get this once symptoms resolve or after 6 months. Will put you on our list.   Pneumonia vaccines: Prevnar and Pneumovax recommended after age 13  Tetanus booster: Tdap recommended every 10 years. Done 04/2016 Cancer screenings    Colon cancer screening: recommended for everyone at age 28. Will refer to gastroenterology for colonoscopy. Call me if you decide you's prefer the Cologuard.   Prostate cancer screening: PSA blood test around age 33  Lung cancer screening: not needed for non-smokers  Infection screenings . HIV, Gonorrhea/Chlamydia: screening as needed. . Hepatitis C: recommended for anyone born 36-1967. Done 03/2016 and negative.  . TB: certain at-risk populations, or depending on work requirements and/or travel history Other . Bone Density Test: recommended for men at age 72 . Abdominal Aortic Aneurysm: screening with ultrasound recommended once for men age 16-75 who have ever smoked         Visit summary with medication list and pertinent instructions was printed for patient to review. All questions at time of visit were answered - patient instructed to contact office with any additional concerns or updates. ER/RTC precautions were reviewed with the patient.   Please note: voice recognition software was used to produce this document, and typos may escape review. Please contact Dr. Lyn Hollingshead for any needed clarifications.     Follow-up plan: Return in about 1 year (around 10/25/2019) for [facility] PHYSICAL, sooner if needed.

## 2018-10-27 ENCOUNTER — Telehealth: Payer: Self-pay | Admitting: Osteopathic Medicine

## 2018-10-27 NOTE — Telephone Encounter (Signed)
-----   Message from Sunnie Nielsen, DO sent at 10/24/2018  9:39 AM EST ----- Regarding: shingles list but please read message! Patient would like the shingles vaccine but he has a recent history of shingles.  Can we put him on the list but make sure that he does not get 1st shot until July 2020? Thanks!

## 2018-10-27 NOTE — Telephone Encounter (Signed)
Pt added

## 2018-11-26 ENCOUNTER — Encounter: Payer: Self-pay | Admitting: Osteopathic Medicine

## 2019-03-06 IMAGING — CT CT RENAL STONE PROTOCOL
2 of 4 series · 16 of 46 positions shown, 18 images · non-contrast
Comparison: None.

CLINICAL DATA: 51-year-old male with intermittent left lower
quadrant and left flank pain for several days. Blood in urine on UA.

EXAM:
CT ABDOMEN AND PELVIS WITHOUT CONTRAST
TECHNIQUE: Multidetector CT imaging of the abdomen and pelvis was performed
following the standard protocol without IV contrast.

[Series 2: axial st · axial · 0.83mm/px · z∈[+433,+913]mm · 13 of 106 slices shown, 15 images]
[im 5/106  soft-tissue]
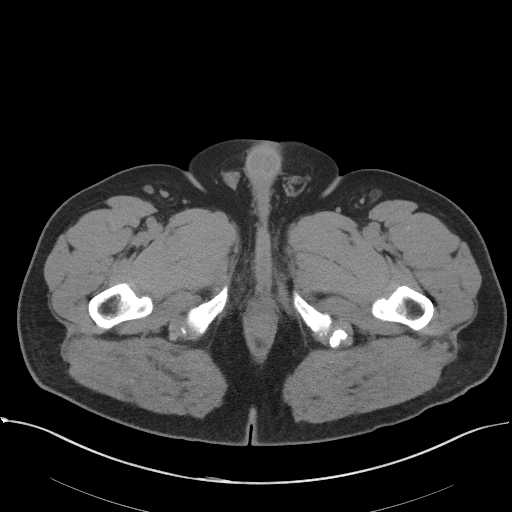
[im 5/106  bone]
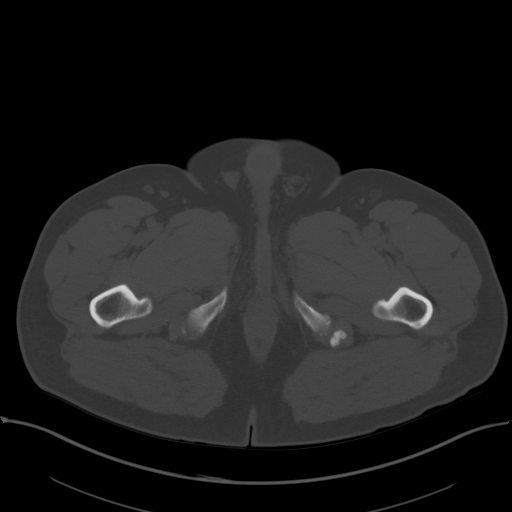
[im 14/106  soft-tissue]
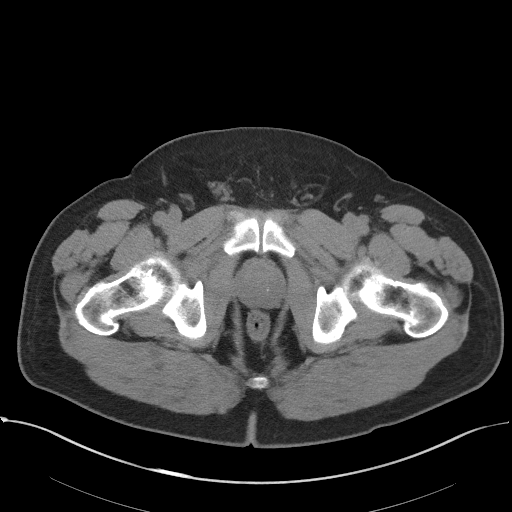
[im 22/106  soft-tissue]
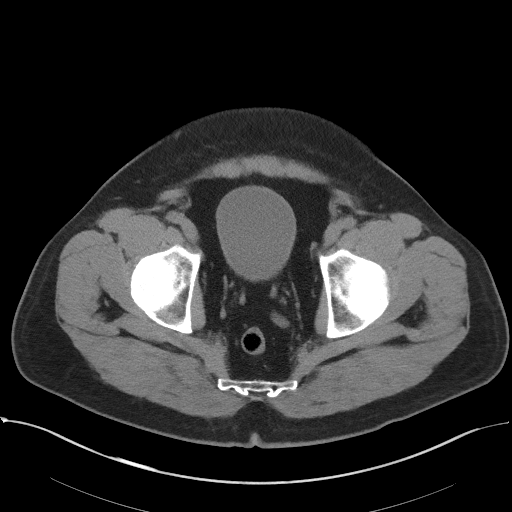
[im 31/106  soft-tissue]
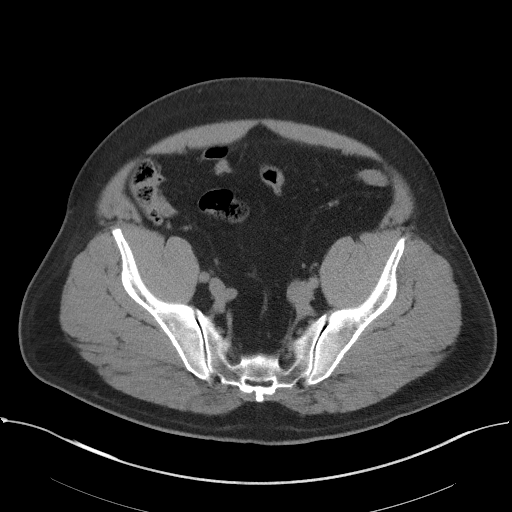
[im 36/106  soft-tissue]
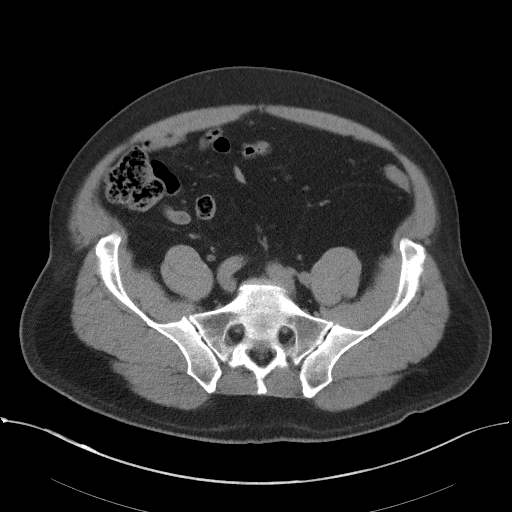
[im 44/106  soft-tissue]
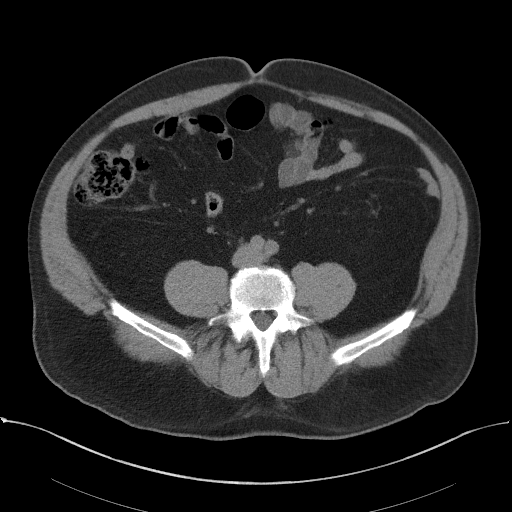
[im 53/106  soft-tissue]
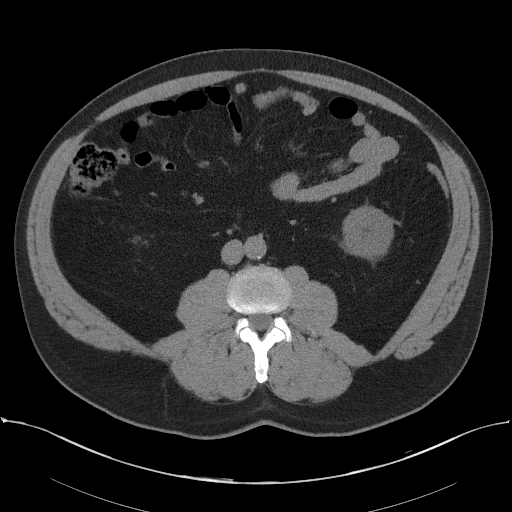
[im 62/106  soft-tissue]
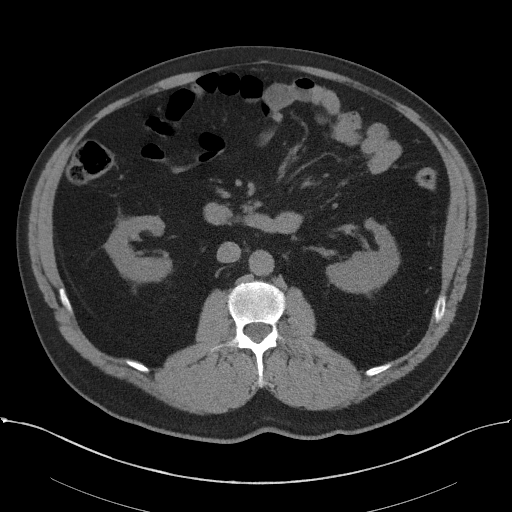
[im 71/106  soft-tissue]
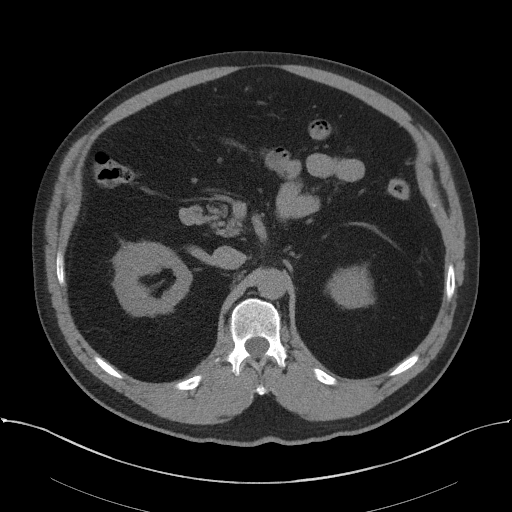
[im 71/106  bone]
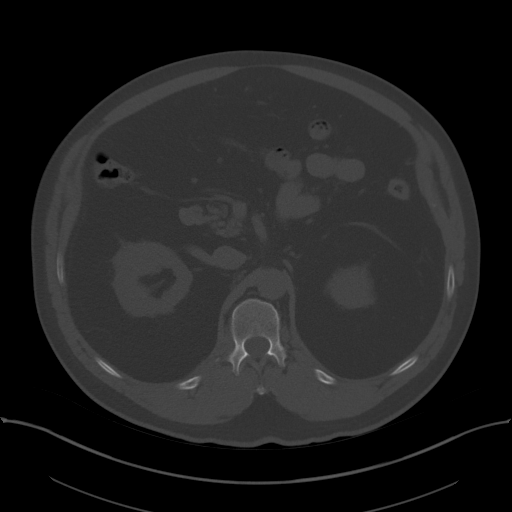
[im 75/106  soft-tissue]
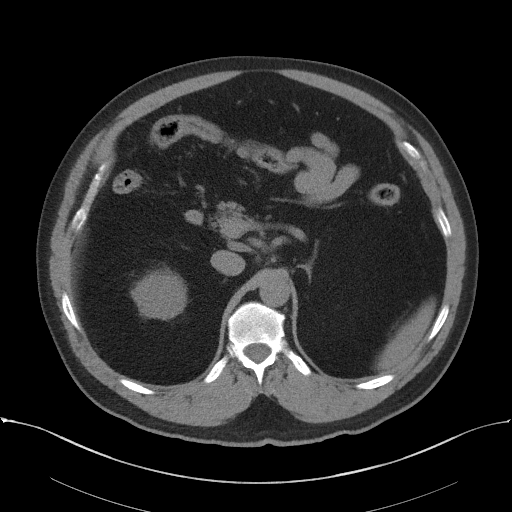
[im 84/106  soft-tissue]
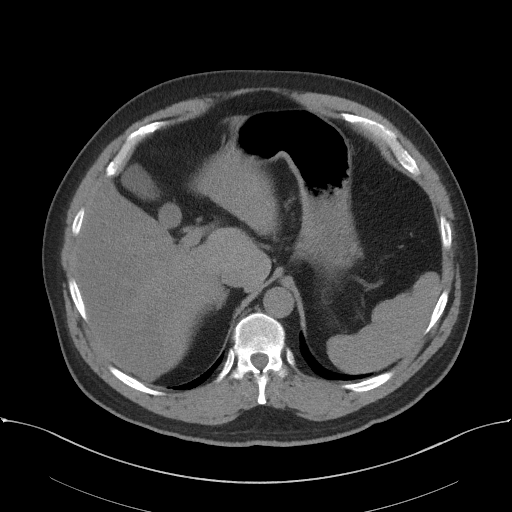
[im 92/106  soft-tissue]
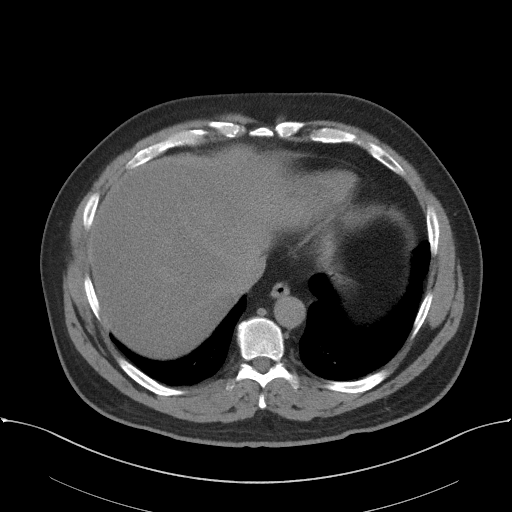
[im 101/106  soft-tissue]
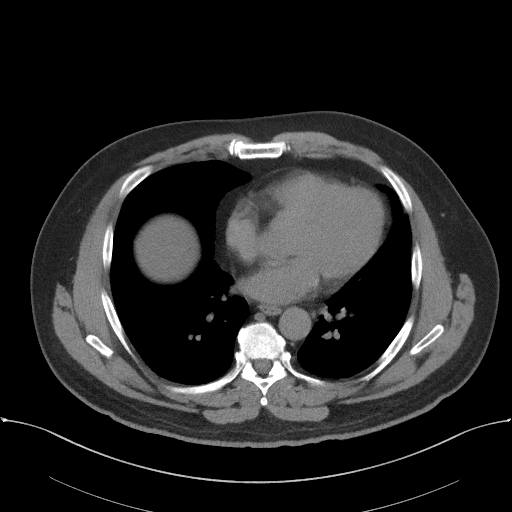

[Series 5: coronal st · coronal · 0.94mm/px · 3 of 106 slices shown]
[im 36/106  soft-tissue]
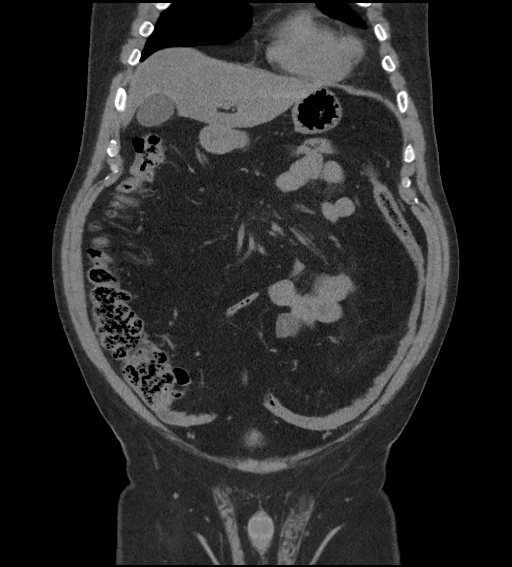
[im 47/106  soft-tissue]
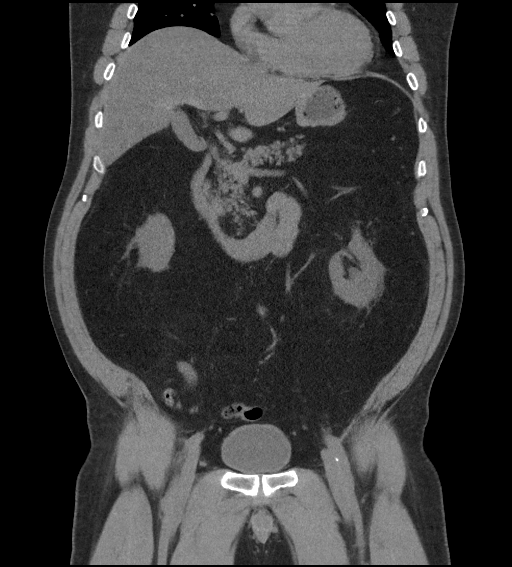
[im 59/106  soft-tissue]
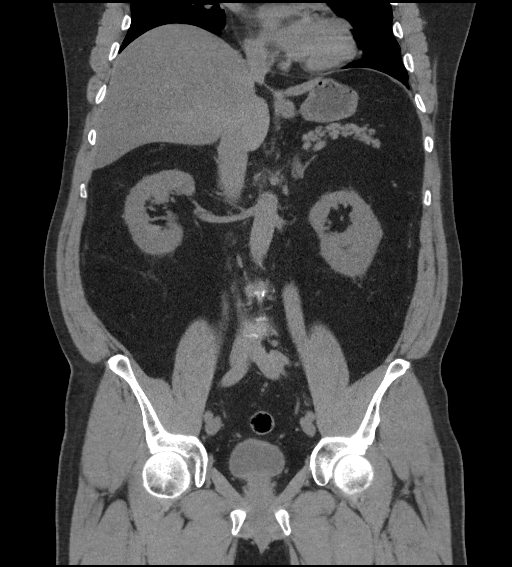

[16 of 46 positions shown; findings below may reference images not displayed]

FINDINGS: Lower chest: No acute abnormality.

Hepatobiliary: Hepatic parenchyma is diffusely hypodense consistent
with steatosis there is focal fatty sparing at the gallbladder
fossa. No focal liver abnormality is seen. No gallstones,
gallbladder wall thickening, or biliary dilatation.

Pancreas: Unremarkable. No pancreatic ductal dilatation or
surrounding inflammatory changes.

Spleen: Normal in size without focal abnormality.

Adrenals/Urinary Tract: Adrenal glands are unremarkable. A 6 mm
stone is noted at the left lower pole. A 1.5 x 1.7 cm hypodensity at
the lower pole demonstrates fluid attenuation. This may represent a
parapelvic cyst or a focal area of early hydronephrosis from the
adjacent stone, particularly as seen on the coronal view. The
bilateral kidneys are otherwise unremarkable, without evidence for
focal lesion, hydronephrosis or nephroureterolithiasis. Bladder is
unremarkable.

Stomach/Bowel: Stomach is within normal limits. Appendix appears
normal. No evidence of bowel wall thickening, distention, or
inflammatory changes.

Vascular/Lymphatic: No significant vascular findings are present. No
enlarged abdominal or pelvic lymph nodes.

Reproductive: Prostate is unremarkable.

Other: No abdominal wall hernia or abnormality. No abdominopelvic
ascites.

Musculoskeletal: No acute or significant osseous findings.
IMPRESSION: 1. No definite evidence for obstructive nephroureterolithiasis. A 6
mm stone is noted at the left lower pole (PACs slice 49 of 106). An
adjacent, fluid attenuating hypodensity may represent a parapelvic
cyst or focal area of early hydronephrosis from the stone.
2. Hepatic steatosis with focal sparing at the gallbladder fossa.
3. Otherwise unremarkable examination.

## 2019-10-16 ENCOUNTER — Ambulatory Visit: Payer: PRIVATE HEALTH INSURANCE | Admitting: Osteopathic Medicine

## 2019-10-26 ENCOUNTER — Ambulatory Visit (INDEPENDENT_AMBULATORY_CARE_PROVIDER_SITE_OTHER): Payer: PRIVATE HEALTH INSURANCE

## 2019-10-26 ENCOUNTER — Encounter: Payer: Self-pay | Admitting: Osteopathic Medicine

## 2019-10-26 ENCOUNTER — Other Ambulatory Visit: Payer: Self-pay

## 2019-10-26 ENCOUNTER — Ambulatory Visit (INDEPENDENT_AMBULATORY_CARE_PROVIDER_SITE_OTHER): Payer: PRIVATE HEALTH INSURANCE | Admitting: Osteopathic Medicine

## 2019-10-26 VITALS — BP 131/79 | HR 70 | Temp 97.8°F | Wt 228.0 lb

## 2019-10-26 DIAGNOSIS — M7062 Trochanteric bursitis, left hip: Secondary | ICD-10-CM

## 2019-10-26 DIAGNOSIS — L989 Disorder of the skin and subcutaneous tissue, unspecified: Secondary | ICD-10-CM

## 2019-10-26 DIAGNOSIS — M7632 Iliotibial band syndrome, left leg: Secondary | ICD-10-CM

## 2019-10-26 DIAGNOSIS — Z0000 Encounter for general adult medical examination without abnormal findings: Secondary | ICD-10-CM | POA: Diagnosis not present

## 2019-10-26 MED ORDER — PREDNISONE 20 MG PO TABS: 20.0000 mg | ORAL_TABLET | Freq: Two times a day (BID) | ORAL | 0 refills | Status: DC

## 2019-10-26 MED ORDER — NAPROXEN 500 MG PO TABS: 500.0000 mg | ORAL_TABLET | Freq: Two times a day (BID) | ORAL | 3 refills | Status: DC

## 2019-10-26 NOTE — Progress Notes (Signed)
Carlos Potts is a 54 y.o. male who presents to  [facility] at [facility]  today, 10/26/19, seeking care for the following: . Hip pain - new concern . Skin lesion - new concern      ASSESSMENT & PLAN with other pertinent history/findings:  1. Trochanteric bursitis of left hip 2. Iliotibial band syndrome, left Worse over past 3 weeks, worse w/ sitting long periods, not worse w/ any particular strain or movements, no injury, exam (+)tenderness ofver L greater trochanter  --> Home exercises, NSAIDS, steroids, declined formal PT for now, XR today  3. Benign skin growth Cryotherapy applied to benign appearing warty skin lesion on scalp, RTC if recurs and will biopsy, given area of sun exposure   4. Annual physical exam Labs ordered for future visit. Annual physical / preventive care was NOT performed or billed today.     Orders Placed This Encounter  Procedures  . DG Hip Unilat W OR W/O Pelvis 2-3 Views Left  . CBC  . COMPLETE METABOLIC PANEL WITH GFR  . Lipid panel  . PSA, Total with Reflex to PSA, Free    Meds ordered this encounter  Medications  . naproxen (NAPROSYN) 500 MG tablet    Sig: Take 1 tablet (500 mg total) by mouth 2 (two) times daily with a meal.    Dispense:  60 tablet    Refill:  3  . predniSONE (DELTASONE) 20 MG tablet    Sig: Take 1 tablet (20 mg total) by mouth 2 (two) times daily with a meal.    Dispense:  10 tablet    Refill:  0       Follow-up instructions: Return for VISIT WITH SPORTS MED IF HIP PAIN NO BETTER. VIRTUAL ANNUAL PHYSICAL W/ DR A 1 WEEK .                                         BP 131/79   Pulse 70   Temp 97.8 F (36.6 C) (Oral)   Wt 228 lb (103.4 kg)   SpO2 96% Comment: on RA  BMI 34.67 kg/m   Current Meds  Medication Sig  . cyclobenzaprine (FLEXERIL) 5 MG tablet Take 1-2 tablets (5-10 mg total) by mouth 3 (three) times daily as needed  for muscle spasms.  Marland Kitchen ibuprofen (ADVIL,MOTRIN) 200 MG tablet Take 400-600 mg by mouth every 6 (six) hours as needed for headache, mild pain or moderate pain.    No results found for this or any previous visit (from the past 72 hour(s)).  No results found.  Depression screen [facility] 2/9 10/24/2018 08/15/2017  Decreased Interest 0 0  Down, Depressed, Hopeless 0 1  PHQ - 2 Score 0 1  Altered sleeping 0 -  Tired, decreased energy 1 -  Change in appetite 0 -  Feeling bad or failure about yourself  0 -  Trouble concentrating 0 -  Moving slowly or fidgety/restless 0 -  Suicidal thoughts 0 -  PHQ-9 Score 1 -    GAD 7 : Generalized Anxiety Score 10/24/2018  Nervous, Anxious, on Edge 0  Control/stop worrying 0  Worry too much - different things 0  Trouble relaxing 0  Restless 0  Easily annoyed or irritable 0  Afraid - awful might happen 0  Total GAD 7 Score 0      All questions at time of  visit were answered - patient instructed to contact office with any additional concerns or updates.  ER/RTC precautions were reviewed with the patient.  Please note: voice recognition software was used to produce this document, and typos may escape review. Please contact Dr. Sheppard Coil for any needed clarifications.

## 2019-10-29 LAB — COMPLETE METABOLIC PANEL WITH GFR
AG Ratio: 1.7 (calc) (ref 1.0–2.5)
ALT: 58 U/L — ABNORMAL HIGH (ref 9–46)
AST: 34 U/L (ref 10–35)
Albumin: 4.3 g/dL (ref 3.6–5.1)
Alkaline phosphatase (APISO): 74 U/L (ref 35–144)
BUN: 17 mg/dL (ref 7–25)
CO2: 31 mmol/L (ref 20–32)
Calcium: 9.8 mg/dL (ref 8.6–10.3)
Chloride: 103 mmol/L (ref 98–110)
Creat: 0.95 mg/dL (ref 0.70–1.33)
GFR, Est African American: 104 mL/min/{1.73_m2} (ref >=60)
GFR, Est Non African American: 90 mL/min/{1.73_m2} (ref >=60)
Globulin: 2.6 g/dL (calc) (ref 1.9–3.7)
Glucose, Bld: 111 mg/dL — ABNORMAL HIGH (ref 65–99)
Potassium: 4.5 mmol/L (ref 3.5–5.3)
Sodium: 140 mmol/L (ref 135–146)
Total Bilirubin: 0.5 mg/dL (ref 0.2–1.2)
Total Protein: 6.9 g/dL (ref 6.1–8.1)

## 2019-10-29 LAB — LIPID PANEL
Cholesterol: 168 mg/dL (ref <200)
HDL: 36 mg/dL — ABNORMAL LOW (ref >=40)
LDL Cholesterol (Calc): 107 mg/dL (calc) — ABNORMAL HIGH
Non-HDL Cholesterol (Calc): 132 mg/dL (calc) — ABNORMAL HIGH (ref <130)
Total CHOL/HDL Ratio: 4.7 (calc) (ref <5.0)
Triglycerides: 136 mg/dL (ref <150)

## 2019-10-29 LAB — CBC
HCT: 46.5 % (ref 38.5–50.0)
Hemoglobin: 15.4 g/dL (ref 13.2–17.1)
MCH: 30.4 pg (ref 27.0–33.0)
MCHC: 33.1 g/dL (ref 32.0–36.0)
MCV: 91.9 fL (ref 80.0–100.0)
MPV: 11.1 fL (ref 7.5–12.5)
Platelets: 212 10*3/uL (ref 140–400)
RBC: 5.06 10*6/uL (ref 4.20–5.80)
RDW: 13 % (ref 11.0–15.0)
WBC: 5.6 10*3/uL (ref 3.8–10.8)

## 2019-10-29 LAB — HEMOGLOBIN A1C W/OUT EAG: Hgb A1c MFr Bld: 5.9 % of total Hgb — ABNORMAL HIGH (ref <5.7)

## 2019-10-29 LAB — PSA, TOTAL WITH REFLEX TO PSA, FREE: PSA, Total: 0.8 ng/mL (ref <=4.0)

## 2019-11-30 ENCOUNTER — Other Ambulatory Visit: Payer: Self-pay

## 2019-11-30 ENCOUNTER — Ambulatory Visit (INDEPENDENT_AMBULATORY_CARE_PROVIDER_SITE_OTHER): Payer: PRIVATE HEALTH INSURANCE | Admitting: Osteopathic Medicine

## 2019-11-30 ENCOUNTER — Encounter: Payer: Self-pay | Admitting: Osteopathic Medicine

## 2019-11-30 VITALS — BP 135/81 | HR 80 | Temp 98.0°F | Ht 68.0 in | Wt 224.2 lb

## 2019-11-30 DIAGNOSIS — R7309 Other abnormal glucose: Secondary | ICD-10-CM

## 2019-11-30 DIAGNOSIS — Z0000 Encounter for general adult medical examination without abnormal findings: Secondary | ICD-10-CM

## 2019-11-30 DIAGNOSIS — L989 Disorder of the skin and subcutaneous tissue, unspecified: Secondary | ICD-10-CM

## 2019-11-30 NOTE — Progress Notes (Signed)
Carlos Potts is a 54 y.o. male who presents to  [place] at [facility]  today, 11/30/19, seeking care for the following: . ANNUAL . SCALP LESION - appears actinic damage, re-freeze today, if no better will shave bx      ASSESSMENT & PLAN with other pertinent history/findings:  The primary encounter diagnosis was Annual physical exam. Diagnoses of Elevated hemoglobin A1c and Skin lesion of scalp were also pertinent to this visit.   Constitutional:  . VSS, see nurse notes . General Appearance: alert, well-developed, well-nourished, NAD Eyes: Marland Kitchen Normal lids and conjunctive, non-icteric sclera . PERRLA Ears, Nose, Mouth, Throat: . Mask in place over nose/mouth  . Normal external auditory canal and TM bilaterally Neck: . No masses, trachea midline . No thyroid enlargement/tenderness/mass appreciated Respiratory: . Normal respiratory effort . No dullness/hyper-resonance to percussion . Breath sounds normal, no wheeze/rhonchi/rales Cardiovascular: . S1/S2 normal, no murmur/rub/gallop auscultated . No lower extremity edema Gastrointestinal: . Nontender, no masses . No hepatomegaly, no splenomegaly . No hernia appreciated Musculoskeletal:  . Gait normal . No clubbing/cyanosis of digits Neurological: . No cranial nerve deficit on limited exam . Motor and sensation intact and symmetric Psychiatric: . Normal judgment/insight . Normal mood and affect    Patient Instructions  General Preventive Care  Most recent routine screening lipids/other labs: already done!   Everyone should have blood pressure checked once per year.   Tobacco: don't!   Alcohol: responsible moderation is ok for most adults - if you have concerns about your alcohol intake, please talk to me!   Exercise: as tolerated to reduce risk of cardiovascular disease and diabetes. Strength training will also prevent osteoporosis.   Mental health: if need for  mental health care (medicines, counseling, other), or concerns about moods, please let me know!   Sexual health: if ever a need for STD testing, or if concerns with libido/pain problems, please let me know!   Advanced Directive: Living Will and/or Healthcare Power of Attorney recommended for all adults, regardless of age or health.  Vaccines  Flu vaccine: recommended for almost everyone, every fall.   Shingles vaccine: Shingrix recommended after age 24. Can get this once symptoms resolve or after 6 months.   Pneumonia vaccines: Prevnar and Pneumovax recommended after age 37  Tetanus booster: Tdap recommended every 10 years. Done 04/2016  COVID: recommended as soon as you're eligible! Please follow [email or web address] for updates on eligibility and vaccination appointment. As of now, we do not anticipate our clinic having vaccines anytime soon.  Cancer screenings   Colon cancer screening: Colonoscopy vs Cologuard   Prostate cancer screening: PSA blood test annually age 57-71  Lung cancer screening: not needed for non-smokers  Infection screenings  HIV, Gonorrhea/Chlamydia: screening as needed.  Hepatitis C: Done 03/2016 and negative.   TB: certain at-risk populations, or depending on work requirements and/or travel history Other  Bone Density Test: recommended for men at age 33  Abdominal Aortic Aneurysm: screening with ultrasound recommended once for men age 37-75 who have ever smoked     No orders of the defined types were placed in this encounter.   No orders of the defined types were placed in this encounter.      Follow-up instructions: Return in about 3 months (around 02/29/2020) for IN-OFFICE VISIT: A1C and Shingrix .  BP 135/81   Pulse 80   Temp 98 F (36.7 C) (Oral)   Ht 5\' 8"  (1.727 m)   Wt 224 lb 3.2 oz (101.7 kg)   BMI 34.09 kg/m   Current Meds  Medication Sig  .  cyclobenzaprine (FLEXERIL) 5 MG tablet Take 1-2 tablets (5-10 mg total) by mouth 3 (three) times daily as needed for muscle spasms.  ibuprofen (ADVIL,MOTRIN) 200 MG tablet Take 400-600 mg by mouth every 6 (six) hours as needed for headache, mild pain or moderate pain.    No results found for this or any previous visit (from the past 72 hour(s)).  No results found.  Depression screen [facility] 2/9 11/30/2019 10/24/2018 08/15/2017  Decreased Interest 0 0 0  Down, Depressed, Hopeless 0 0 1  PHQ - 2 Score 0 0 1  Altered sleeping 0 0 -  Tired, decreased energy 1 1 -  Change in appetite 2 0 -  Feeling bad or failure about yourself  0 0 -  Trouble concentrating 0 0 -  Moving slowly or fidgety/restless 0 0 -  Suicidal thoughts 0 0 -  PHQ-9 Score 3 1 -  Difficult doing work/chores Not difficult at all - -    GAD 7 : Generalized Anxiety Score 11/30/2019 10/24/2018  Nervous, Anxious, on Edge 0 0  Control/stop worrying 0 0  Worry too much - different things 0 0  Trouble relaxing 0 0  Restless 0 0  Easily annoyed or irritable 0 0  Afraid - awful might happen 0 0  Total GAD 7 Score 0 0  Anxiety Difficulty Not difficult at all -      All questions at time of visit were answered - patient instructed to contact office with any additional concerns or updates.  ER/RTC precautions were reviewed with the patient.  Please note: voice recognition software was used to produce this document, and typos may escape review. Please contact Dr. 10/26/2018 for any needed clarifications.     Immunization History  Administered Date(s) Administered  . Influenza-Unspecified 06/07/2017, 06/05/2018, 06/04/2019  . Tdap 04/27/2016   The 10-year ASCVD risk score 06/27/2016 DC Denman George., et al., 2013) is: 6.7%   Values used to calculate the score:     Age: 37 years     Sex: Male     Is Non-Hispanic African American: No     Diabetic: No     Tobacco smoker: No     Systolic Blood Pressure: 135 mmHg     Is BP treated: No      HDL Cholesterol: 36 mg/dL     Total Cholesterol: 168 mg/dL

## 2019-11-30 NOTE — Patient Instructions (Addendum)
General Preventive Care  Most recent routine screening lipids/other labs: already done!   Everyone should have blood pressure checked once per year.   Tobacco: don't!   Alcohol: responsible moderation is ok for most adults - if you have concerns about your alcohol intake, please talk to me!   Exercise: as tolerated to reduce risk of cardiovascular disease and diabetes. Strength training will also prevent osteoporosis.   Mental health: if need for mental health care (medicines, counseling, other), or concerns about moods, please let me know!   Sexual health: if ever a need for STD testing, or if concerns with libido/pain problems, please let me know!   Advanced Directive: Living Will and/or Healthcare Power of Attorney recommended for all adults, regardless of age or health.  Vaccines  Flu vaccine: recommended for almost everyone, every fall.   Shingles vaccine: Shingrix recommended after age 48. Can get this once symptoms resolve or after 6 months.   Pneumonia vaccines: Prevnar and Pneumovax recommended after age 35  Tetanus booster: Tdap recommended every 10 years. Done 04/2016  COVID: recommended as soon as you're eligible! Please follow [email or web address] for updates on eligibility and vaccination appointment. As of now, we do not anticipate our clinic having vaccines anytime soon.  Cancer screenings   Colon cancer screening: Colonoscopy vs Cologuard   Prostate cancer screening: PSA blood test annually age 85-71  Lung cancer screening: not needed for non-smokers  Infection screenings  HIV, Gonorrhea/Chlamydia: screening as needed.  Hepatitis C: Done 03/2016 and negative.   TB: certain at-risk populations, or depending on work requirements and/or travel history Other  Bone Density Test: recommended for men at age 60  Abdominal Aortic Aneurysm: screening with ultrasound recommended once for men age 35-75 who have ever smoked

## 2020-01-05 ENCOUNTER — Other Ambulatory Visit: Payer: Self-pay

## 2020-01-05 ENCOUNTER — Encounter: Payer: Self-pay | Admitting: Osteopathic Medicine

## 2020-01-05 ENCOUNTER — Ambulatory Visit: Payer: PRIVATE HEALTH INSURANCE | Admitting: Osteopathic Medicine

## 2020-01-05 VITALS — BP 134/85 | HR 80 | Temp 97.9°F | Wt 224.1 lb

## 2020-01-05 DIAGNOSIS — L989 Disorder of the skin and subcutaneous tissue, unspecified: Secondary | ICD-10-CM | POA: Diagnosis not present

## 2020-01-05 DIAGNOSIS — K649 Unspecified hemorrhoids: Secondary | ICD-10-CM

## 2020-01-05 MED ORDER — LIDOCAINE 5 % EX OINT: 1.0000 "application " | TOPICAL_OINTMENT | Freq: Four times a day (QID) | CUTANEOUS | 11 refills | Status: DC | PRN

## 2020-01-05 MED ORDER — HYDROCORTISONE ACETATE 25 MG RE SUPP: 25.0000 mg | Freq: Two times a day (BID) | RECTAL | 2 refills | Status: DC | PRN

## 2020-01-05 NOTE — Progress Notes (Signed)
Carlos Potts is a 54 y.o. male who presents to  [facility] at [facility]  today, 01/05/20, seeking care for the following: . Hemorrhoid - felt lump and pain at anus, started about 5 days ago, used OTC Prep H and took epsom salt bath, these measures helped, still sore but not as painful  . Scalp lesion better but still a small area of warty growth      ASSESSMENT & PLAN with other pertinent history/findings:  The primary encounter diagnosis was Hemorrhoids, unspecified hemorrhoid type. A diagnosis of Scalp lesion was also pertinent to this visit.  On exam, external hemorrhoid, mild tender, no significant inflammation   Actinic scalp lesion better, if not improved will shave bx for sure next time    Meds ordered this encounter  Medications  . hydrocortisone (ANUSOL-HC) 25 MG suppository    Sig: Place 1 suppository (25 mg total) rectally 2 (two) times daily as needed for hemorrhoids.    Dispense:  12 suppository    Refill:  2  . lidocaine (XYLOCAINE) 5 % ointment    Sig: Apply 1 application topically 4 (four) times daily as needed. Rectal / hemorrhoid pain    Dispense:  30 g    Refill:  11       Follow-up instructions: Return if symptoms worsen or fail to improve.                                         BP 134/85 (BP Location: Left Arm, Patient Position: Sitting, Cuff Size: Large)   Pulse 80   Temp 97.9 F (36.6 C) (Oral)   Wt 224 lb 1.9 oz (101.7 kg)   BMI 34.08 kg/m   Current Meds  Medication Sig  . cyclobenzaprine (FLEXERIL) 5 MG tablet Take 1-2 tablets (5-10 mg total) by mouth 3 (three) times daily as needed for muscle spasms.  Marland Kitchen ibuprofen (ADVIL,MOTRIN) 200 MG tablet Take 400-600 mg by mouth every 6 (six) hours as needed for headache, mild pain or moderate pain.    No results found for this or any previous visit (from the past 72 hour(s)).  No results found.  Depression  screen [facility] 2/9 11/30/2019 10/24/2018 08/15/2017  Decreased Interest 0 0 0  Down, Depressed, Hopeless 0 0 1  PHQ - 2 Score 0 0 1  Altered sleeping 0 0 -  Tired, decreased energy 1 1 -  Change in appetite 2 0 -  Feeling bad or failure about yourself  0 0 -  Trouble concentrating 0 0 -  Moving slowly or fidgety/restless 0 0 -  Suicidal thoughts 0 0 -  PHQ-9 Score 3 1 -  Difficult doing work/chores Not difficult at all - -    GAD 7 : Generalized Anxiety Score 11/30/2019 10/24/2018  Nervous, Anxious, on Edge 0 0  Control/stop worrying 0 0  Worry too much - different things 0 0  Trouble relaxing 0 0  Restless 0 0  Easily annoyed or irritable 0 0  Afraid - awful might happen 0 0  Total GAD 7 Score 0 0  Anxiety Difficulty Not difficult at all -      All questions at time of visit were answered - patient instructed to contact office with any additional concerns or updates.  ER/RTC precautions were reviewed with the patient.  Please note: voice recognition software was used to  produce this document, and typos may escape review. Please contact Dr. Sheppard Coil for any needed clarifications.

## 2020-01-05 NOTE — Patient Instructions (Signed)

## 2020-02-24 ENCOUNTER — Ambulatory Visit: Payer: PRIVATE HEALTH INSURANCE | Admitting: Osteopathic Medicine

## 2020-04-28 DIAGNOSIS — Z20828 Contact with and (suspected) exposure to other viral communicable diseases: Secondary | ICD-10-CM | POA: Diagnosis not present

## 2020-07-28 ENCOUNTER — Other Ambulatory Visit: Payer: Self-pay

## 2020-07-28 MED ORDER — CYCLOBENZAPRINE HCL 5 MG PO TABS: 5.0000 mg | ORAL_TABLET | Freq: Three times a day (TID) | ORAL | 2 refills | Status: DC | PRN

## 2020-08-07 ENCOUNTER — Other Ambulatory Visit: Payer: Self-pay

## 2020-08-07 ENCOUNTER — Emergency Department
Admission: EM | Admit: 2020-08-07 | Discharge: 2020-08-07 | Disposition: A | Discharge status: Home / Self Care | Payer: BC Managed Care – PPO | Source: Home / Self Care

## 2020-08-07 ENCOUNTER — Encounter: Payer: Self-pay | Admitting: Emergency Medicine

## 2020-08-07 DIAGNOSIS — L298 Other pruritus: Secondary | ICD-10-CM

## 2020-08-07 DIAGNOSIS — B86 Scabies: Secondary | ICD-10-CM

## 2020-08-07 MED ORDER — CETIRIZINE HCL 10 MG PO TABS: 10.0000 mg | ORAL_TABLET | Freq: Every day | ORAL | 0 refills | Status: DC

## 2020-08-07 MED ORDER — PREDNISONE 50 MG PO TABS: 50.0000 mg | ORAL_TABLET | Freq: Every day | ORAL | 0 refills | Status: AC

## 2020-08-07 MED ORDER — PERMETHRIN 5 % EX CREA: TOPICAL_CREAM | CUTANEOUS | 0 refills | Status: DC

## 2020-08-07 NOTE — ED Triage Notes (Signed)
Patient c/o red spots on both arms and feet since Thursday.  They are spreading and itching.  Patient's wife is currently dealing with shingles.  Patient is currently vaccinated.

## 2020-08-07 NOTE — Discharge Instructions (Signed)
°  Use medication as prescribed You can take benadryl at night to help with itching and sleep. Follow up with primary care later this week if not improving.

## 2020-08-07 NOTE — ED Provider Notes (Signed)
[facility]    CSN: [id number] Arrival date & time: 08/07/20  1450      History   Chief Complaint Chief Complaint  Patient presents with  . Rash    HPI Rayburn Mundis is a 54 y.o. male.   HPI  Kayn Haymore is a 54 y.o. male presenting to UC with c/o red itchy spots on arms and feet for 2 days, he has also noticed some spots on his hands and back.  His wife is currently being tx for shingles. Pt reports hx of shingles pain on Left flank in the past but never developed a rash.  Denies fever, chills, n/v/d. No new soaps, lotions or medications.  He reports staying in a hotel about 1 month ago before moving into his current new home but no rash until 2 days ago. His wife's rash is different.    Past Medical History:  Diagnosis Date  . Arthritis    FINGERS  . Back ache   . GERD (gastroesophageal reflux disease)   . History of kidney stones   . History of shingles 10/24/2018   08/2018  . Seasonal allergies   . Tinnitus     Patient Active Problem List   Diagnosis Date Noted  . Post herpetic neuralgia 10/24/2018  . History of shingles 10/24/2018  . Tinnitus of both ears 09/10/2017  . Insomnia 09/10/2017  . Kidney stone   . Chalazion left lower eyelid 07/27/2016  . Osteoarthritis of both hands 07/27/2016  . Abnormal thyroid blood test 04/27/2016  . Rhinitis, allergic 03/28/2016  . Bilateral low back pain without sciatica 03/28/2016  . Adult BMI > 30 03/28/2016    Past Surgical History:  Procedure Laterality Date  . CYSTOSCOPY/URETEROSCOPY/HOLMIUM LASER/STENT PLACEMENT Left 08/23/2017   Procedure: CYSTOSCOPY/RETROGRADE/URETEROSCOPY/HOLMIUM LASER/STENT PLACEMENT;  Surgeon: Heloise Purpura, MD;  Location: [facility];  Service: Urology;  Laterality: Left;  . EXTRACORPOREAL SHOCK WAVE LITHOTRIPSY Left 08/12/2017   Procedure: LEFT EXTRACORPOREAL SHOCK WAVE LITHOTRIPSY (ESWL);  Surgeon: Sebastian Ache, MD;  Location: [facility];  Service: Urology;  Laterality: Left;   . STENT TO LEFT KINEY  08/17/2017   [place]  . TONSILLECTOMY  AGE 2       Home Medications    Prior to Admission medications   Medication Sig Start Date End Date Taking? Authorizing Provider  cyclobenzaprine (FLEXERIL) 5 MG tablet Take 1-2 tablets (5-10 mg total) by mouth 3 (three) times daily as needed for muscle spasms. 07/28/20  Yes Sunnie Nielsen, DO  ibuprofen (ADVIL,MOTRIN) 200 MG tablet Take 400-600 mg by mouth every 6 (six) hours as needed for headache, mild pain or moderate pain.   Yes [provider]  cetirizine (ZYRTEC) 10 MG tablet Take 1 tablet (10 mg total) by mouth daily. 08/07/20   Lurene Shadow, PA-C  hydrocortisone (ANUSOL-HC) 25 MG suppository Place 1 suppository (25 mg total) rectally 2 (two) times daily as needed for hemorrhoids. 01/05/20   Sunnie Nielsen, DO  lidocaine (XYLOCAINE) 5 % ointment Apply 1 application topically 4 (four) times daily as needed. Rectal / hemorrhoid pain 01/05/20   Sunnie Nielsen, DO  permethrin (ELIMITE) 5 % cream Apply head to toe, avoid face and genitals. Leave on 8-14 hours, rinse thoroughly. 08/07/20   Lurene Shadow, PA-C  predniSONE (DELTASONE) 50 MG tablet Take 1 tablet (50 mg total) by mouth daily with breakfast for 5 days. 08/07/20 08/12/20  Lurene Shadow, PA-C    Family History Family History  Problem Relation Age of  Onset  . Cancer Father   . Cancer Sister     Social History Social History   Tobacco Use  . Smoking status: Never Smoker  . Smokeless tobacco: Never Used  Vaping Use  . Vaping Use: Never used  Substance Use Topics  . Alcohol use: No  . Drug use: No     Allergies   Patient has no known allergies.   Review of Systems Review of Systems  Constitutional: Negative for chills and fever.  HENT: Negative for facial swelling and sore throat.   Musculoskeletal: Negative for arthralgias and myalgias.  Skin: Positive for rash.     Physical Exam Triage Vital Signs ED  Triage Vitals [08/07/20 1636]  Enc Vitals Group     BP (!) 143/79     Pulse Rate 79     Resp 18     Temp 98.5 F (36.9 C)     Temp Source Oral     SpO2 96 %     Weight      Height      Head Circumference      Peak Flow      Pain Score 1     Pain Loc      Pain Edu?      Excl. in GC?    No data found.  Updated Vital Signs BP (!) 143/79 (BP Location: Left Arm)   Pulse 79   Temp 98.5 F (36.9 C) (Oral)   Resp 18   SpO2 96%   Visual Acuity Right Eye Distance:   Left Eye Distance:   Bilateral Distance:    Right Eye Near:   Left Eye Near:    Bilateral Near:     Physical Exam Vitals and nursing note reviewed.  Constitutional:      Appearance: Normal appearance. He is well-developed and well-nourished.  HENT:     Head: Normocephalic and atraumatic.  Eyes:     Extraocular Movements: EOM normal.  Cardiovascular:     Rate and Rhythm: Normal rate.  Pulmonary:     Effort: Pulmonary effort is normal.  Musculoskeletal:        General: Normal range of motion.     Cervical back: Normal range of motion.  Skin:    General: Skin is warm and dry.     Findings: Erythema and rash present.     Comments: Diffuse erythematous papular rash on hands, feet, arms, and upper back. Non-tender. No drainage.   Neurological:     Mental Status: He is alert and oriented to person, place, and time.  Psychiatric:        Mood and Affect: Mood and affect normal.        Behavior: Behavior normal.      UC Treatments / Results  Labs (all labs ordered are listed, but only abnormal results are displayed) Labs Reviewed - No data to display  EKG   Radiology No results found.  Procedures Procedures (including critical care time)  Medications Ordered in UC Medications - No data to display  Initial Impression / Assessment and Plan / UC Course  I have reviewed the triage vital signs and the nursing notes.  Pertinent labs & imaging results that were available during my care of the  patient were reviewed by me and considered in my medical decision making (see chart for details).     Exam most c/w scabies Will tx with permethrin cream and symptomatically for itching F/u with PCP next week as needed. Final  Clinical Impressions(s) / UC Diagnoses   Final diagnoses:  Scabies  Pruritic erythematous rash     Discharge Instructions      Use medication as prescribed You can take benadryl at night to help with itching and sleep. Follow up with primary care later this week if not improving.     ED Prescriptions    Medication Sig Dispense Auth. Provider   predniSONE (DELTASONE) 50 MG tablet Take 1 tablet (50 mg total) by mouth daily with breakfast for 5 days. 5 tablet Waylan Rocher O, PA-C   cetirizine (ZYRTEC) 10 MG tablet Take 1 tablet (10 mg total) by mouth daily. 30 tablet Doroteo Glassman, Sandon Yoho O, PA-C   permethrin (ELIMITE) 5 % cream Apply head to toe, avoid face and genitals. Leave on 8-14 hours, rinse thoroughly. 60 g Lurene Shadow, [place]     PDMP not reviewed this encounter.   Lurene Shadow, [place] 08/08/20 1148

## 2020-09-08 ENCOUNTER — Other Ambulatory Visit: Payer: Self-pay

## 2020-09-08 ENCOUNTER — Ambulatory Visit (INDEPENDENT_AMBULATORY_CARE_PROVIDER_SITE_OTHER): Payer: BC Managed Care – PPO | Admitting: Osteopathic Medicine

## 2020-09-08 ENCOUNTER — Encounter: Payer: Self-pay | Admitting: Osteopathic Medicine

## 2020-09-08 VITALS — BP 135/85 | HR 90 | Temp 98.2°F | Wt 224.1 lb

## 2020-09-08 DIAGNOSIS — M25522 Pain in left elbow: Secondary | ICD-10-CM | POA: Diagnosis not present

## 2020-09-08 DIAGNOSIS — B86 Scabies: Secondary | ICD-10-CM | POA: Diagnosis not present

## 2020-09-08 MED ORDER — PERMETHRIN 5 % EX CREA: TOPICAL_CREAM | CUTANEOUS | 0 refills | Status: DC

## 2020-09-08 MED ORDER — CYCLOBENZAPRINE HCL 5 MG PO TABS: 5.0000 mg | ORAL_TABLET | Freq: Three times a day (TID) | ORAL | 2 refills | Status: DC | PRN

## 2020-09-08 MED ORDER — IVERMECTIN 3 MG PO TABS: 200.0000 ug/kg | ORAL_TABLET | Freq: Once | ORAL | 0 refills | Status: AC

## 2020-09-08 NOTE — Progress Notes (Signed)
HPI: Carlos Potts is a 55 y.o. male who  has a past medical history of Arthritis, Back ache, GERD (gastroesophageal reflux disease), History of kidney stones, History of shingles (10/24/2018), Seasonal allergies, and Tinnitus.  he presents to [facility] today, 09/08/20,  for chief complaint of:  Scabies . Context: moved into new house beginning of December; dx with scabies 12/12; treated with permethrin; vacuumed bed and washed cloths and bedding in hot water same day; wife does not have bites; does have pet that seem itchy, though they are like this normally  . Location: On hands, arms, feet, legs . Quality: pruritic erythematous rash . Duration: 1 month . Modifying factors: has continued to get new bites even after application of permethrin 2 times . Assoc signs/symptoms: itching  L elbow pain  . Context: recently doing lots of work on his new house  . Location: lateral L elbow . Quality: aching  . Severity: "not that bad" . Duration: couple of weeks  . Modifying factors: worse with protonation of arm; not improved by ibuprofen or flexeril  . Assoc signs/symptoms: no radiation, swelling, fever, burning/shooting pain    Past medical, surgical, social and family history reviewed:  Patient Active Problem List   Diagnosis Date Noted  . Post herpetic neuralgia 10/24/2018  . History of shingles 10/24/2018  . Tinnitus of both ears 09/10/2017  . Insomnia 09/10/2017  . Kidney stone   . Chalazion left lower eyelid 07/27/2016  . Osteoarthritis of both hands 07/27/2016  . Abnormal thyroid blood test 04/27/2016  . Rhinitis, allergic 03/28/2016  . Bilateral low back pain without sciatica 03/28/2016  . Adult BMI > 30 03/28/2016    Past Surgical History:  Procedure Laterality Date  . CYSTOSCOPY/URETEROSCOPY/HOLMIUM LASER/STENT PLACEMENT Left 08/23/2017   Procedure: CYSTOSCOPY/RETROGRADE/URETEROSCOPY/HOLMIUM LASER/STENT PLACEMENT;  Surgeon: Heloise Purpura, MD;  Location: [facility];  Service: Urology;  Laterality: Left;  . EXTRACORPOREAL SHOCK WAVE LITHOTRIPSY Left 08/12/2017   Procedure: LEFT EXTRACORPOREAL SHOCK WAVE LITHOTRIPSY (ESWL);  Surgeon: Sebastian Ache, MD;  Location: [facility];  Service: Urology;  Laterality: Left;  . STENT TO LEFT KINEY  08/17/2017   IN [place]  . TONSILLECTOMY  AGE 30    Social History   Tobacco Use  . Smoking status: Never Smoker  . Smokeless tobacco: Never Used  Substance Use Topics  . Alcohol use: No    Family History  Problem Relation Age of Onset  . Cancer Father   . Cancer Sister      Current medication list and allergy/intolerance information reviewed:    Current Outpatient Medications  Medication Sig Dispense Refill  . cetirizine (ZYRTEC) 10 MG tablet Take 1 tablet (10 mg total) by mouth daily. 30 tablet 0  . hydrocortisone (ANUSOL-HC) 25 MG suppository Place 1 suppository (25 mg total) rectally 2 (two) times daily as needed for hemorrhoids. 12 suppository 2  . ibuprofen (ADVIL,MOTRIN) 200 MG tablet Take 400-600 mg by mouth every 6 (six) hours as needed for headache, mild pain or moderate pain.    Marland Kitchen ivermectin (STROMECTOL) 3 MG TABS tablet Take 7 tablets (21,000 mcg total) by mouth once for 1 dose. repeat dose in 7 to 14 days 8 tablet 0  . lidocaine (XYLOCAINE) 5 % ointment Apply 1 application topically 4 (four) times daily as needed. Rectal / hemorrhoid pain 30 g 11  . permethrin (ELIMITE) 5 % cream Apply head to toe, avoid face and genitals. Leave on 8-14 hours, rinse thoroughly. 60 g 0  .  cyclobenzaprine (FLEXERIL) 5 MG tablet Take 1-2 tablets (5-10 mg total) by mouth 3 (three) times daily as needed for muscle spasms. 60 tablet 2   No current facility-administered medications for this visit.    No Known Allergies    Review of Systems:  Constitutional:  No  fever  Musculoskeletal: + new arthralgia in L elbow  Skin: +  Rash on hands, arms, feet, and legs   Exam:  BP  135/85 (BP Location: Left Arm, Patient Position: Sitting, Cuff Size: Large)   Pulse 90   Temp 98.2 F (36.8 C) (Oral)   Wt 224 lb 1.3 oz (101.6 kg)   BMI 34.07 kg/m   Constitutional: VS see above. General Appearance: alert, well-developed, well-nourished, NAD  Eyes: Normal lids and conjunctive, non-icteric sclera  Musculoskeletal: Gait normal. Pain in L elbow with protonation against resistance, no pain with suppination  Neurological: Normal balance/coordination. No tremor.    Skin: warm, dry. pruritic erythematous rash on hands, arms, feet, and legs most consistent with scabies   Psychiatric: Normal judgment/insight. Normal mood and affect.   No results found for this or any previous visit (from the past 72 hour(s)).  No results found.   ASSESSMENT/PLAN: The primary encounter diagnosis was Scabies. A diagnosis of Left elbow pain was also pertinent to this visit.   Scabies  CMP  Rx ivermectin to start pending liver function tests  Rx permethrin   Continue zyrtec and benadryl for itch relief   Discussed taking pets to vet to ensure they do not have infection   Discussed contacting pest control v diatomaceous earth to help clear the house of any possible infestation   Return if symptoms do not improve   L elbow pain - seem to be lateral epicondylitis given pain near lateral epicondyle with protonation against resistance   Add 500mg  tylenol to ibuprofen pending liver function test  Given exercises for strength and stretching   Recommended compression sleeve   Follow up if pain does not improve to consider referral to sports med v PT     Orders Placed This Encounter  Procedures  . COMPLETE METABOLIC PANEL WITH GFR    Meds ordered this encounter  Medications  . ivermectin (STROMECTOL) 3 MG TABS tablet    Sig: Take 7 tablets (21,000 mcg total) by mouth once for 1 dose. repeat dose in 7 to 14 days    Dispense:  8 tablet    Refill:  0  . permethrin (ELIMITE) 5  % cream    Sig: Apply head to toe, avoid face and genitals. Leave on 8-14 hours, rinse thoroughly.    Dispense:  60 g    Refill:  0  . cyclobenzaprine (FLEXERIL) 5 MG tablet    Sig: Take 1-2 tablets (5-10 mg total) by mouth 3 (three) times daily as needed for muscle spasms.    Dispense:  60 tablet    Refill:  2    Patient Instructions  RASH - presumed scabies / other bite  Repeat permethrin cream  Add ivermectin oral pills as long as liver labs are ok   Consider veterinary visit for itchy pets  Consider pest control visit to the house or can try diatomaceous earth on the floors  Continue Zyrtec, can add Benadryl 25-50 mg up to 2 times per day for itching   ELBOW - lateral epicondylitis   See attached home therapies  Compression sleeve might help  Can add Tylenol 500 mg 3-4 times per day if liver labs  are ok   If no better, please let me know and we can refer for PT or visit w/ Dr T here (sports med)            Visit summary with medication list and pertinent instructions was printed for patient to review. All questions at time of visit were answered - patient instructed to contact office with any additional concerns or updates. ER/RTC precautions were reviewed with the patient.      Please note: voice recognition software was used to produce this document, and typos may escape review. Please contact Dr. Lyn Hollingshead for any needed clarifications.     Follow-up plan: Return if symptoms worsen or fail to improve.

## 2020-09-08 NOTE — Patient Instructions (Addendum)
RASH - presumed scabies / other bite  Repeat permethrin cream  Add ivermectin oral pills as long as liver labs are ok   Consider veterinary visit for itchy pets  Consider pest control visit to the house or can try diatomaceous earth on the floors  Continue Zyrtec, can add Benadryl 25-50 mg up to 2 times per day for itching   ELBOW - lateral epicondylitis   See attached home therapies  Compression sleeve might help  Can add Tylenol 500 mg 3-4 times per day if liver labs are ok   If no better, please let me know and we can refer for PT or visit w/ Dr T here (sports med)

## 2020-09-09 ENCOUNTER — Other Ambulatory Visit: Payer: Self-pay

## 2020-09-09 ENCOUNTER — Other Ambulatory Visit: Payer: Self-pay | Admitting: Family Medicine

## 2020-09-09 LAB — COMPLETE METABOLIC PANEL WITH GFR
AG Ratio: 1.8 (calc) (ref 1.0–2.5)
ALT: 42 U/L (ref 9–46)
AST: 28 U/L (ref 10–35)
Albumin: 4.6 g/dL (ref 3.6–5.1)
Alkaline phosphatase (APISO): 84 U/L (ref 35–144)
BUN: 14 mg/dL (ref 7–25)
CO2: 30 mmol/L (ref 20–32)
Calcium: 9.9 mg/dL (ref 8.6–10.3)
Chloride: 100 mmol/L (ref 98–110)
Creat: 0.96 mg/dL (ref 0.70–1.33)
GFR, Est African American: 102 mL/min/{1.73_m2} (ref >=60)
GFR, Est Non African American: 88 mL/min/{1.73_m2} (ref >=60)
Globulin: 2.6 g/dL (calc) (ref 1.9–3.7)
Glucose, Bld: 94 mg/dL (ref 65–99)
Potassium: 5.2 mmol/L (ref 3.5–5.3)
Sodium: 139 mmol/L (ref 135–146)
Total Bilirubin: 0.4 mg/dL (ref 0.2–1.2)
Total Protein: 7.2 g/dL (ref 6.1–8.1)

## 2020-09-09 MED ORDER — CETIRIZINE HCL 10 MG PO TABS: 10.0000 mg | ORAL_TABLET | Freq: Every day | ORAL | 2 refills | Status: DC

## 2020-09-09 MED ORDER — IVERMECTIN 3 MG PO TABS: 200.0000 ug/kg | ORAL_TABLET | Freq: Once | ORAL | 0 refills | Status: AC

## 2020-09-09 NOTE — Progress Notes (Signed)
rx sent to [facility] 

## 2020-09-09 NOTE — Telephone Encounter (Signed)
[place] called and left a message requesting a refill on Cetrizine. This was prescribed by urgent care.

## 2021-03-29 ENCOUNTER — Telehealth: Payer: Self-pay | Admitting: Osteopathic Medicine

## 2021-03-29 NOTE — Telephone Encounter (Signed)
Can schedule whenever as a nurse visit, or he can get it at his appointment

## 2021-03-29 NOTE — Telephone Encounter (Signed)
Patient was wanting to get Shingles Vaccine and didn't know if this is okay to schedule or if we have this in ? He is scheduled for a physical with PCP on 04/25/21. AM

## 2021-03-30 NOTE — Telephone Encounter (Signed)
Called and left message for patient regarding this. Advised to call back if he wants to schedule this as a separate nurse visit

## 2021-04-25 ENCOUNTER — Encounter: Payer: Self-pay | Admitting: Osteopathic Medicine

## 2021-04-25 ENCOUNTER — Other Ambulatory Visit: Payer: Self-pay

## 2021-04-25 ENCOUNTER — Ambulatory Visit (INDEPENDENT_AMBULATORY_CARE_PROVIDER_SITE_OTHER): Payer: BC Managed Care – PPO | Admitting: Osteopathic Medicine

## 2021-04-25 VITALS — BP 117/81 | HR 94 | Ht 68.0 in | Wt 230.0 lb

## 2021-04-25 DIAGNOSIS — R7989 Other specified abnormal findings of blood chemistry: Secondary | ICD-10-CM | POA: Diagnosis not present

## 2021-04-25 DIAGNOSIS — R7309 Other abnormal glucose: Secondary | ICD-10-CM | POA: Diagnosis not present

## 2021-04-25 DIAGNOSIS — K635 Polyp of colon: Secondary | ICD-10-CM

## 2021-04-25 DIAGNOSIS — Z125 Encounter for screening for malignant neoplasm of prostate: Secondary | ICD-10-CM

## 2021-04-25 DIAGNOSIS — Z0000 Encounter for general adult medical examination without abnormal findings: Secondary | ICD-10-CM

## 2021-04-25 MED ORDER — ZOSTER VAC RECOMB ADJUVANTED 50 MCG/0.5ML IM SUSR: 0.5000 mL | Freq: Once | INTRAMUSCULAR | 1 refills | Status: AC

## 2021-04-25 MED ORDER — CYCLOBENZAPRINE HCL 5 MG PO TABS: 5.0000 mg | ORAL_TABLET | Freq: Three times a day (TID) | ORAL | 3 refills | Status: DC | PRN

## 2021-04-25 NOTE — Patient Instructions (Addendum)
General Preventive Care Most recent routine screening lipids/other labs: ordered!  Blood pressure goal <130/80 Tobacco: don't!  Alcohol: responsible moderation is ok for most adults - if you have concerns about your alcohol intake, please talk to me!  Exercise: as tolerated to reduce risk of cardiovascular disease and diabetes. Strength training will also prevent osteoporosis.  Mental health: if need for mental health care (medicines, counseling, other), or concerns about moods, please let me know!  Sexual health: if ever a need for STD testing, or if concerns with libido/pain problems, please let me know!  Advanced Directive: Living Will and/or Healthcare Power of Attorney recommended for all adults, regardless of age or health.  Vaccines Flu vaccine: recommended every fall.  Shingles vaccine: Shingrix recommended (see printed Rx)  Pneumonia vaccines: Prevnar and Pneumovax recommended after age 53 Tetanus booster: Tdap recommended every 10 years. Done 04/2026 COVID: strongly recommended! Renee Rival!  Cancer screenings  Colon cancer screening: Colonoscopy repeat per GI  Prostate cancer screening: PSA blood test annually age 57+ Lung cancer screening: not needed for non-smokers  Infection screenings HIV, Gonorrhea/Chlamydia: screening as needed. Hepatitis C: Done 03/2016 and negative.  TB: certain at-risk populations, or depending on work requirements and/or travel history Other Bone Density Test: recommended for men at age 32 Abdominal Aortic Aneurysm: screening with ultrasound recommended once for men age 66-75 who have ever smoked 100+ cigarettes              FYI to my patients: After six years here, I will be leaving practice at [facility]. My last day here will be 05/26/2021. I will continue to provide your care up until that date, and you will still be considered a patient here after that as long as you want to be!    You will get a letter in the  mail explaining details, but after 05/26/2021, my patients have several options to continue care:  1) you can establish care with Dr. Everrett Coombe or Christen Butter NP, who are accepting new patients here and are absorbing many folks from my current patient panel, OR...  2) you can see any available provider here on as-needed basis until my official replacement starts (hiring a new doctor has not been finalized yet), OR.Marland KitchenMarland Kitchen 3) if you choose to seek care elsewhere, this office will be happy to facilitate transfer of records, and will refill medications on a case-by-case basis.   It is bittersweet to leave! 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 I have truly enjoyed taking care of folks here, but I am also excited for my next adventure doing something a bit different. Take care, and please let us know if you have any questions!   -Dr. Mervyn Skeeters.

## 2021-04-25 NOTE — Progress Notes (Signed)
Carlos Potts is a 55 y.o. male who presents to  [facility] at [facility]  today, 04/25/21, seeking care for the following:  Annual physical      ASSESSMENT & PLAN with other pertinent findings:  The primary encounter diagnosis was Annual physical exam. Diagnoses of Elevated hemoglobin A1c, Abnormal thyroid blood test, Prostate cancer screening, and Benign colon polyp - colonoscopy 11/2019 were also pertinent to this visit.    Patient Instructions  General Preventive Care Most recent routine screening lipids/other labs: ordered!  Blood pressure goal <130/80 Tobacco: don't!  Alcohol: responsible moderation is ok for most adults - if you have concerns about your alcohol intake, please talk to me!  Exercise: as tolerated to reduce risk of cardiovascular disease and diabetes. Strength training will also prevent osteoporosis.  Mental health: if need for mental health care (medicines, counseling, other), or concerns about moods, please let me know!  Sexual health: if ever a need for STD testing, or if concerns with libido/pain problems, please let me know!  Advanced Directive: Living Will and/or Healthcare Power of Attorney recommended for all adults, regardless of age or health.  Vaccines Flu vaccine: recommended every fall.  Shingles vaccine: Shingrix recommended (see printed Rx)  Pneumonia vaccines: Prevnar and Pneumovax recommended after age 74 Tetanus booster: Tdap recommended every 10 years. Done 04/2026 COVID: strongly recommended! Renee Rival!  Cancer screenings  Colon cancer screening: Colonoscopy repeat per GI  Prostate cancer screening: PSA blood test annually age 62+ Lung cancer screening: not needed for non-smokers  Infection screenings HIV, Gonorrhea/Chlamydia: screening as needed. Hepatitis C: Done 03/2016 and negative.  TB: certain at-risk populations, or depending on work requirements and/or travel history Other Bone Density  Test: recommended for men at age 16 Abdominal Aortic Aneurysm: screening with ultrasound recommended once for men age 62-75 who have ever smoked 100+ cigarettes              FYI to my patients: After six years here, I will be leaving practice at [facility]. My last day here will be 05/26/2021. I will continue to provide your care up until that date, and you will still be considered a patient here after that as long as you want to be!    You will get a letter in the mail explaining details, but after 05/26/2021, my patients have several options to continue care:  1) you can establish care with Dr. Everrett Coombe or Christen Butter NP, who are accepting new patients here and are absorbing many folks from my current patient panel, OR...  2) you can see any available provider here on as-needed basis until my official replacement starts (hiring a new doctor has not been finalized yet), OR.Marland KitchenMarland Kitchen 3) if you choose to seek care elsewhere, this office will be happy to facilitate transfer of records, and will refill medications on a case-by-case basis.   It is bittersweet to leave! 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 I have truly enjoyed taking care of folks here, but I am also excited for my next adventure doing something a bit different. Take care, and please let us know if you have any questions!   -Dr. Mervyn Skeeters.      Orders Placed This Encounter  Procedures   CBC   COMPLETE METABOLIC PANEL WITH GFR   Lipid panel   TSH  Hemoglobin A1c   PSA, Total with Reflex to PSA, Free     Meds ordered this encounter  Medications   Zoster Vaccine Adjuvanted [facility]) injection    Sig: Inject 0.5 mLs into the muscle once for 1 dose. Repeat in 2-6 months. Please fax confirmation of vaccination to Dr Lyn Hollingshead [phone]    Dispense:  0.5 mL    Refill:   1   cyclobenzaprine (FLEXERIL) 5 MG tablet    Sig: Take 1-2 tablets (5-10 mg total) by mouth 3 (three) times daily as needed for muscle spasms.    Dispense:  90 tablet    Refill:  3      See below for relevant physical exam findings  See below for recent lab and imaging results reviewed  Medications, allergies, PMH, PSH, SocH, FamH reviewed below    Follow-up instructions: Return in about 1 year (around 04/25/2022) for ROUTINE ANNUAL PHYSICAL (OR SOONER IF NEEDED), OUR FRONT DESK WILL CALL YOU TO SCHEDULE.                                        Exam:  BP 117/81   Pulse 94   Ht 5\' 8"  (1.727 m)   Wt 230 lb (104.3 kg)   BMI 34.97 kg/m  Constitutional: VS see above. General Appearance: alert, well-developed, well-nourished, NAD Neck: No masses, trachea midline.  Respiratory: Normal respiratory effort. no wheeze, no rhonchi, no rales Cardiovascular: S1/S2 normal, no murmur, no rub/gallop auscultated. RRR.  Musculoskeletal: Gait normal. Symmetric and independent movement of all extremities Abdominal: non-tender, non-distended, no appreciable organomegaly, neg Murphy's, BS [id number] Neurological: Normal balance/coordination. No tremor. Skin: warm, dry, intact.  Psychiatric: Normal judgment/insight. Normal mood and affect. Oriented x3.   Current Meds  Medication Sig   Zoster Vaccine Adjuvanted [facility]) injection Inject 0.5 mLs into the muscle once for 1 dose. Repeat in 2-6 months. Please fax confirmation of vaccination to Dr [facility] [phone]    No Known Allergies  Patient Active Problem List   Diagnosis Date Noted   Benign colon polyp - colonoscopy 11/2019 04/25/2021   Post herpetic neuralgia 10/24/2018   History of shingles 10/24/2018   Tinnitus of both ears 09/10/2017   Insomnia 09/10/2017   Kidney stone    Chalazion left lower eyelid 07/27/2016   Osteoarthritis of both hands 07/27/2016   Abnormal thyroid blood test 04/27/2016    Rhinitis, allergic 03/28/2016   Bilateral low back pain without sciatica 03/28/2016   Adult BMI > 30 03/28/2016    Family History  Problem Relation Age of Onset   Cancer Father    Cancer Sister     Social History   Tobacco Use  Smoking Status Never  Smokeless Tobacco Never    Past Surgical History:  Procedure Laterality Date   CYSTOSCOPY/URETEROSCOPY/HOLMIUM LASER/STENT PLACEMENT Left 08/23/2017   Procedure: CYSTOSCOPY/RETROGRADE/URETEROSCOPY/HOLMIUM LASER/STENT PLACEMENT;  Surgeon: 08/25/2017, MD;  Location: [facility];  Service: Urology;  Laterality: Left;   EXTRACORPOREAL SHOCK WAVE LITHOTRIPSY Left 08/12/2017   Procedure: LEFT EXTRACORPOREAL SHOCK WAVE LITHOTRIPSY (ESWL);  Surgeon: 08/14/2017, MD;  Location: [facility];  Service: Urology;  Laterality: Left;   STENT TO LEFT KINEY  08/17/2017   IN [place]   TONSILLECTOMY  AGE 87    Immunization History  Administered Date(s) Administered   Influenza-Unspecified 06/07/2017, 06/05/2018, 06/04/2019   PFIZER(Purple Top)SARS-COV-2 Vaccination 10/30/2019, 11/28/2019, 07/12/2020   Tdap 04/27/2016  No results found for this or any previous visit (from the past 2160 hour(s)).  No results found.     All questions at time of visit were answered - patient instructed to contact office with any additional concerns or updates. ER/RTC precautions were reviewed with the patient as applicable.   Please note: manual typing as well as voice recognition software may have been used to produce this document - typos may escape review. Please contact Dr. Sheppard Coil for any needed clarifications.

## 2021-04-26 DIAGNOSIS — R7989 Other specified abnormal findings of blood chemistry: Secondary | ICD-10-CM | POA: Diagnosis not present

## 2021-04-26 DIAGNOSIS — Z0000 Encounter for general adult medical examination without abnormal findings: Secondary | ICD-10-CM | POA: Diagnosis not present

## 2021-04-26 DIAGNOSIS — R7309 Other abnormal glucose: Secondary | ICD-10-CM | POA: Diagnosis not present

## 2021-04-26 DIAGNOSIS — Z125 Encounter for screening for malignant neoplasm of prostate: Secondary | ICD-10-CM | POA: Diagnosis not present

## 2021-04-27 LAB — COMPLETE METABOLIC PANEL WITH GFR
AG Ratio: 1.7 (calc) (ref 1.0–2.5)
ALT: 46 U/L (ref 9–46)
AST: 29 U/L (ref 10–35)
Albumin: 4.3 g/dL (ref 3.6–5.1)
Alkaline phosphatase (APISO): 75 U/L (ref 35–144)
BUN: 12 mg/dL (ref 7–25)
CO2: 30 mmol/L (ref 20–32)
Calcium: 9.5 mg/dL (ref 8.6–10.3)
Chloride: 103 mmol/L (ref 98–110)
Creat: 0.91 mg/dL (ref 0.70–1.30)
Globulin: 2.5 g/dL (calc) (ref 1.9–3.7)
Glucose, Bld: 109 mg/dL — ABNORMAL HIGH (ref 65–99)
Potassium: 4.8 mmol/L (ref 3.5–5.3)
Sodium: 140 mmol/L (ref 135–146)
Total Bilirubin: 0.4 mg/dL (ref 0.2–1.2)
Total Protein: 6.8 g/dL (ref 6.1–8.1)
eGFR: 98 mL/min/{1.73_m2} (ref >=60)

## 2021-04-27 LAB — LIPID PANEL
Cholesterol: 155 mg/dL (ref <200)
HDL: 44 mg/dL (ref >=40)
LDL Cholesterol (Calc): 90 mg/dL (calc)
Non-HDL Cholesterol (Calc): 111 mg/dL (calc) (ref <130)
Total CHOL/HDL Ratio: 3.5 (calc) (ref <5.0)
Triglycerides: 114 mg/dL (ref <150)

## 2021-04-27 LAB — HEMOGLOBIN A1C
Hgb A1c MFr Bld: 5.9 % of total Hgb — ABNORMAL HIGH (ref <5.7)
Mean Plasma Glucose: 123 mg/dL
eAG (mmol/L): 6.8 mmol/L

## 2021-04-27 LAB — CBC
HCT: 46.6 % (ref 38.5–50.0)
Hemoglobin: 15.6 g/dL (ref 13.2–17.1)
MCH: 30.2 pg (ref 27.0–33.0)
MCHC: 33.5 g/dL (ref 32.0–36.0)
MCV: 90.1 fL (ref 80.0–100.0)
MPV: 11.6 fL (ref 7.5–12.5)
Platelets: 231 10*3/uL (ref 140–400)
RBC: 5.17 10*6/uL (ref 4.20–5.80)
RDW: 13.2 % (ref 11.0–15.0)
WBC: 6.7 10*3/uL (ref 3.8–10.8)

## 2021-04-27 LAB — TSH: TSH: 4.18 mIU/L (ref 0.40–4.50)

## 2021-04-27 LAB — PSA, TOTAL WITH REFLEX TO PSA, FREE: PSA, Total: 0.9 ng/mL (ref <=4.0)

## 2021-06-12 DIAGNOSIS — Z23 Encounter for immunization: Secondary | ICD-10-CM | POA: Diagnosis not present

## 2021-07-04 DIAGNOSIS — Z23 Encounter for immunization: Secondary | ICD-10-CM | POA: Diagnosis not present

## 2021-10-04 DIAGNOSIS — B349 Viral infection, unspecified: Secondary | ICD-10-CM | POA: Diagnosis not present

## 2022-02-17 DIAGNOSIS — M7552 Bursitis of left shoulder: Secondary | ICD-10-CM | POA: Diagnosis not present

## 2022-02-17 DIAGNOSIS — M7540 Impingement syndrome of unspecified shoulder: Secondary | ICD-10-CM | POA: Diagnosis not present

## 2022-06-04 ENCOUNTER — Encounter: Payer: Self-pay | Admitting: Emergency Medicine

## 2022-06-04 ENCOUNTER — Ambulatory Visit
Admission: EM | Admit: 2022-06-04 | Discharge: 2022-06-04 | Disposition: A | Discharge status: Home / Self Care | Payer: BC Managed Care – PPO | Attending: Urgent Care | Admitting: Urgent Care

## 2022-06-04 DIAGNOSIS — M7061 Trochanteric bursitis, right hip: Secondary | ICD-10-CM | POA: Diagnosis not present

## 2022-06-04 MED ORDER — PREDNISONE 10 MG (21) PO TBPK: ORAL_TABLET | Freq: Every day | ORAL | 0 refills | Status: DC

## 2022-06-04 NOTE — ED Triage Notes (Signed)
Pt states since Saturday he has been having right sided hip pain. States he did a lot of walking on Saturday but denies injury. States he had bursitis in past on that side.

## 2022-06-04 NOTE — Discharge Instructions (Signed)
You appear to have greater trochanteric bursitis of your right hip. This is an inflammation of the sac around the bony prominence. Please alternate ice and heat to the affected area. Please perform the hip bursitis rehab stretches attached to this handout. Please take the prednisone as prescribed, take in the morning to prevent insomnia at night. Do not take additional anti-inflammatory medications while on the prednisone, such as Aleve, Advil, Motrin, ibuprofen. You may take over-the-counter Tylenol or topical Voltaren if necessary. Please follow-up with sports medicine should your symptoms persist.

## 2022-06-04 NOTE — ED Provider Notes (Signed)
[facility]    CSN: [id number] Arrival date & time: 06/04/22  1214      History   Chief Complaint Chief Complaint  Patient presents with   Hip Pain    HPI Michio Thier is a 56 y.o. male.   Pleasant 56 year old male with a known history of arthritis and pre-diabetes presents today due to concerns of 2-day history of right hip pain.  He does have a history of bursitis in the hip in the past.  He has not been bothered by it for quite some time.  He states h [place], walking around extensively.  He was also on a 2-hour car ride.  States he joined a walking group through his work, and feels that his increased physical activity has flared up his bursitis.  Denies any abdominal symptoms or bulging.  No radiation to the groin.  The pain is located to his lateral right hip over his greater trochanter only.  It does not extend down to his knee and no pain in his back.  He takes over-the-counter ibuprofen for his arthritis pretty consistently, no additional treatments tried.   Hip Pain    Past Medical History:  Diagnosis Date   Arthritis    FINGERS   Back ache    GERD (gastroesophageal reflux disease)    History of kidney stones    History of shingles 10/24/2018   08/2018   Seasonal allergies    Tinnitus     Patient Active Problem List   Diagnosis Date Noted   Benign colon polyp - colonoscopy 11/2019 04/25/2021   Post herpetic neuralgia 10/24/2018   History of shingles 10/24/2018   Tinnitus of both ears 09/10/2017   Insomnia 09/10/2017   Kidney stone    Chalazion left lower eyelid 07/27/2016   Osteoarthritis of both hands 07/27/2016   Abnormal thyroid blood test 04/27/2016   Rhinitis, allergic 03/28/2016   Bilateral low back pain without sciatica 03/28/2016   Adult BMI > 30 03/28/2016    Past Surgical History:  Procedure Laterality Date   CYSTOSCOPY/URETEROSCOPY/HOLMIUM LASER/STENT PLACEMENT Left 08/23/2017   Procedure:  CYSTOSCOPY/RETROGRADE/URETEROSCOPY/HOLMIUM LASER/STENT PLACEMENT;  Surgeon: Heloise Purpura, MD;  Location: [facility];  Service: Urology;  Laterality: Left;   EXTRACORPOREAL SHOCK WAVE LITHOTRIPSY Left 08/12/2017   Procedure: LEFT EXTRACORPOREAL SHOCK WAVE LITHOTRIPSY (ESWL);  Surgeon: Sebastian Ache, MD;  Location: [facility];  Service: Urology;  Laterality: Left;   STENT TO LEFT KINEY  08/17/2017   [place]   TONSILLECTOMY  AGE 51       Home Medications    Prior to Admission medications   Medication Sig Start Date End Date Taking? Authorizing Provider  predniSONE (STERAPRED UNI-PAK 21 TAB) 10 MG (21) TBPK tablet Take by mouth daily. Take 6 tabs by mouth daily  for 1 days, then 5 tabs for 1 days, then 4 tabs for 1 days, then 3 tabs for 1 days, 2 tabs for 1 days, then 1 tab by mouth daily for 1 days 06/04/22  Yes Mikeila Burgen L, PA  cyclobenzaprine (FLEXERIL) 5 MG tablet Take 1-2 tablets (5-10 mg total) by mouth 3 (three) times daily as needed for muscle spasms. 04/25/21   Sunnie Nielsen, DO    Family History Family History  Problem Relation Age of Onset   Cancer Father    Cancer Sister     Social History Social History   Tobacco Use   Smoking status: Never   Smokeless tobacco:  Never  Vaping Use   Vaping Use: Never used  Substance Use Topics   Alcohol use: No   Drug use: No     Allergies   Patient has no known allergies.   Review of Systems Review of Systems As per HPI  Physical Exam Triage Vital Signs ED Triage Vitals  Enc Vitals Group     BP 06/04/22 1225 137/87     Pulse Rate 06/04/22 1225 77     Resp 06/04/22 1225 16     Temp 06/04/22 1225 97.6 F (36.4 C)     Temp Source 06/04/22 1225 Oral     SpO2 06/04/22 1225 96 %     Weight --      Height --      Head Circumference --      Peak Flow --      Pain Score 06/04/22 1226 2     Pain Loc --      Pain Edu? --      Excl. in GC? --    No data found.  Updated Vital Signs BP 137/87 (BP  Location: Right Arm)   Pulse 77   Temp 97.6 F (36.4 C) (Oral)   Resp 16   SpO2 96%   Visual Acuity Right Eye Distance:   Left Eye Distance:   Bilateral Distance:    Right Eye Near:   Left Eye Near:    Bilateral Near:     Physical Exam Vitals and nursing note reviewed.  Constitutional:      General: He is not in acute distress.    Appearance: Normal appearance. He is obese. He is not ill-appearing or toxic-appearing.  HENT:     Head: Normocephalic.  Cardiovascular:     Rate and Rhythm: Normal rate.     Pulses: Normal pulses.  Pulmonary:     Effort: Pulmonary effort is normal. No respiratory distress.  Musculoskeletal:        General: Tenderness (reproducible tenderness over R greater trochanter) present. No swelling. Normal range of motion.     Lumbar back: Normal. No swelling, deformity, spasms, tenderness or bony tenderness. Normal range of motion. Negative right straight leg raise test and negative left straight leg raise test.     Right hip: Bony tenderness present. No deformity, lacerations, tenderness or crepitus. Normal range of motion. Normal strength.     Left hip: Normal. No deformity, lacerations, tenderness, bony tenderness or crepitus. Normal range of motion. Normal strength.     Right upper leg: Normal. No swelling, edema, deformity, lacerations, tenderness or bony tenderness.     Left upper leg: Normal. No swelling, edema, deformity, lacerations, tenderness or bony tenderness.     Right lower leg: Normal. No edema.     Left lower leg: Normal. No edema.       Legs:     Comments: FROM. No reproducible pain to lateral flexion, rotation or forward flexion.  Skin:    General: Skin is warm and dry.     Capillary Refill: Capillary refill takes less than 2 seconds.     Coloration: Skin is not jaundiced.     Findings: No bruising, erythema or rash.  Neurological:     General: No focal deficit present.     Mental Status: He is alert and oriented to person, place,  and time.     Sensory: No sensory deficit.     Motor: No weakness.     Gait: Gait normal.  Psychiatric:  Mood and Affect: Mood normal.        Behavior: Behavior normal.      UC Treatments / Results  Labs (all labs ordered are listed, but only abnormal results are displayed) Labs Reviewed - No data to display  EKG   Radiology No results found.  Procedures Procedures (including critical care time)  Medications Ordered in UC Medications - No data to display  Initial Impression / Assessment and Plan / UC Course  I have reviewed the triage vital signs and the nursing notes.  Pertinent labs & imaging results that were available during my care of the patient were reviewed by me and considered in my medical decision making (see chart for details).     Trochanteric bursitis R hip - pt with hx of same. Reproducible sx upon exam. Pt has done well with steroids in the past, understands side effect profile. Stretches provided. Recommended f/u with sports medicine or PT should sx persist.   Final Clinical Impressions(s) / UC Diagnoses   Final diagnoses:  Trochanteric bursitis of right hip     Discharge Instructions      You appear to have greater trochanteric bursitis of your right hip. This is an inflammation of the sac around the bony prominence. Please alternate ice and heat to the affected area. Please perform the hip bursitis rehab stretches attached to this handout. Please take the prednisone as prescribed, take in the morning to prevent insomnia at night. Do not take additional anti-inflammatory medications while on the prednisone, such as Aleve, Advil, Motrin, ibuprofen. You may take over-the-counter Tylenol or topical Voltaren if necessary. Please follow-up with sports medicine should your symptoms persist.    ED Prescriptions     Medication Sig Dispense Auth. Provider   predniSONE (STERAPRED UNI-PAK 21 TAB) 10 MG (21) TBPK tablet Take by mouth daily.  Take 6 tabs by mouth daily  for 1 days, then 5 tabs for 1 days, then 4 tabs for 1 days, then 3 tabs for 1 days, 2 tabs for 1 days, then 1 tab by mouth daily for 1 days 21 tablet Romone Shaff L, PA      PDMP not reviewed this encounter.   Chaney Malling, [place] 06/04/22 1250

## 2022-06-19 ENCOUNTER — Ambulatory Visit: Payer: BC Managed Care – PPO | Admitting: Family Medicine

## 2022-06-19 ENCOUNTER — Encounter: Payer: Self-pay | Admitting: Family Medicine

## 2022-06-19 VITALS — BP 133/85 | HR 78 | Ht 68.0 in | Wt 240.0 lb

## 2022-06-19 DIAGNOSIS — M65322 Trigger finger, left index finger: Secondary | ICD-10-CM | POA: Diagnosis not present

## 2022-06-19 DIAGNOSIS — Z23 Encounter for immunization: Secondary | ICD-10-CM

## 2022-06-19 DIAGNOSIS — R7303 Prediabetes: Secondary | ICD-10-CM

## 2022-06-19 DIAGNOSIS — M65321 Trigger finger, right index finger: Secondary | ICD-10-CM | POA: Diagnosis not present

## 2022-06-19 MED ORDER — CYCLOBENZAPRINE HCL 5 MG PO TABS: 5.0000 mg | ORAL_TABLET | Freq: Two times a day (BID) | ORAL | 0 refills | Status: DC | PRN

## 2022-06-19 NOTE — Progress Notes (Signed)
Established patient visit   Patient: Carlos Potts   DOB: Jun 27, 1966   56 y.o. Male  MRN: [id number] Visit Date: 06/19/2022  Today's healthcare provider: Charlton Amor, DO   Chief Complaint  Patient presents with   Establish Care    SUBJECTIVE    Chief Complaint  Patient presents with   Establish Care   HPI  Pt presents to transfer care.   Prediabetes  - last A1C Aug of 2022 was 5.9  - he has been trying to diet with better meals  - has been exercising by walking a day at work.   Arthritis - Pt works at a desk and types all day  - has been using ibuprofen   Pt needs refill on flexiril   Review of Systems  Constitutional:  Negative for activity change, fatigue and fever.  Respiratory:  Negative for cough and shortness of breath.   Cardiovascular:  Negative for chest pain.  Gastrointestinal:  Negative for abdominal pain.  Genitourinary:  Negative for difficulty urinating.      Current Meds  Medication Sig   [DISCONTINUED] cyclobenzaprine (FLEXERIL) 5 MG tablet Take 1-2 tablets (5-10 mg total) by mouth 3 (three) times daily as needed for muscle spasms.    OBJECTIVE    BP 133/85   Pulse 78   Ht 5\' 8"  (1.727 m)   Wt 240 lb (108.9 kg)   SpO2 96%   BMI 36.49 kg/m   Physical Exam Vitals and nursing note reviewed.  Constitutional:      General: He is not in acute distress.    Appearance: Normal appearance.  HENT:     Head: Normocephalic and atraumatic.     Right Ear: External ear normal.     Left Ear: External ear normal.     Nose: Nose normal.  Eyes:     Conjunctiva/sclera: Conjunctivae normal.  Cardiovascular:     Rate and Rhythm: Normal rate.  Pulmonary:     Effort: Pulmonary effort is normal.  Musculoskeletal:     Comments: Herberden and bouchard nodes noted on bilateral hands Has trigger finger on both pointer fingers bilaterally   Neurological:     General: No focal deficit present.     Mental Status: He is alert and oriented to  person, place, and time.  Psychiatric:        Mood and Affect: Mood normal.        Behavior: Behavior normal.        Thought Content: Thought content normal.        Judgment: Judgment normal.       ASSESSMENT & PLAN    Problem List Items Addressed This Visit       Musculoskeletal and Integument   Acquired trigger finger of both index fingers    - offered trigger finger injections but patient said he would like to hold off at this time         Other   Prediabetes - Primary    - last a1c 5.9  - discussed diet and exercise  - ordered blood work to check kidney function and electrolytes       Relevant Orders   HgB A1c   COMPLETE METABOLIC PANEL WITH GFR   Microalbumin / creatinine urine ratio   CBC   Flu Vaccine QUAD 6+ mos PF IM (Fluarix Quad PF) (Completed)    Return in about 4 weeks (around 07/17/2022) for wellness visit .      Meds  ordered this encounter  Medications   cyclobenzaprine (FLEXERIL) 5 MG tablet    Sig: Take 1-2 tablets (5-10 mg total) by mouth 2 (two) times daily as needed for muscle spasms.    Dispense:  30 tablet    Refill:  0    Orders Placed This Encounter  Procedures   Flu Vaccine QUAD 6+ mos PF IM (Fluarix Quad PF)   HgB A1c   COMPLETE METABOLIC PANEL WITH GFR   Microalbumin / creatinine urine ratio   CBC     Owens Loffler, DO  [facility] [phone] (phone) [phone] (fax)  [place]

## 2022-06-19 NOTE — Assessment & Plan Note (Signed)
-   last a1c 5.9  - discussed diet and exercise  - ordered blood work to check kidney function and electrolytes

## 2022-06-19 NOTE — Assessment & Plan Note (Signed)
-   offered trigger finger injections but patient said he would like to hold off at this time

## 2022-06-20 ENCOUNTER — Other Ambulatory Visit: Payer: Self-pay | Admitting: Family Medicine

## 2022-06-20 LAB — HEMOGLOBIN A1C
Hgb A1c MFr Bld: 6.7 % of total Hgb — ABNORMAL HIGH (ref <5.7)
Mean Plasma Glucose: 146 mg/dL
eAG (mmol/L): 8.1 mmol/L

## 2022-06-20 LAB — CBC
HCT: 45.4 % (ref 38.5–50.0)
Hemoglobin: 15.7 g/dL (ref 13.2–17.1)
MCH: 31.3 pg (ref 27.0–33.0)
MCHC: 34.6 g/dL (ref 32.0–36.0)
MCV: 90.6 fL (ref 80.0–100.0)
MPV: 11.3 fL (ref 7.5–12.5)
Platelets: 224 10*3/uL (ref 140–400)
RBC: 5.01 10*6/uL (ref 4.20–5.80)
RDW: 12.9 % (ref 11.0–15.0)
WBC: 6.6 10*3/uL (ref 3.8–10.8)

## 2022-06-20 LAB — COMPLETE METABOLIC PANEL WITH GFR
AG Ratio: 1.7 (calc) (ref 1.0–2.5)
ALT: 64 U/L — ABNORMAL HIGH (ref 9–46)
AST: 33 U/L (ref 10–35)
Albumin: 4.5 g/dL (ref 3.6–5.1)
Alkaline phosphatase (APISO): 82 U/L (ref 35–144)
BUN: 12 mg/dL (ref 7–25)
CO2: 25 mmol/L (ref 20–32)
Calcium: 9.4 mg/dL (ref 8.6–10.3)
Chloride: 103 mmol/L (ref 98–110)
Creat: 0.87 mg/dL (ref 0.70–1.30)
Globulin: 2.6 g/dL (calc) (ref 1.9–3.7)
Glucose, Bld: 132 mg/dL — ABNORMAL HIGH (ref 65–99)
Potassium: 4.4 mmol/L (ref 3.5–5.3)
Sodium: 137 mmol/L (ref 135–146)
Total Bilirubin: 0.5 mg/dL (ref 0.2–1.2)
Total Protein: 7.1 g/dL (ref 6.1–8.1)
eGFR: 100 mL/min/{1.73_m2} (ref >=60)

## 2022-06-20 LAB — MICROALBUMIN / CREATININE URINE RATIO
Creatinine, Urine: 134 mg/dL (ref 20–320)
Microalb, Ur: <0.2 mg/dL

## 2022-06-20 MED ORDER — METFORMIN HCL 500 MG PO TABS: 500.0000 mg | ORAL_TABLET | Freq: Two times a day (BID) | ORAL | 3 refills | Status: DC

## 2022-06-20 MED ORDER — ATORVASTATIN CALCIUM 10 MG PO TABS: 10.0000 mg | ORAL_TABLET | Freq: Every day | ORAL | 3 refills | Status: AC

## 2022-06-23 ENCOUNTER — Ambulatory Visit
Admission: RE | Admit: 2022-06-23 | Discharge: 2022-06-23 | Disposition: A | Discharge status: Home / Self Care | Payer: BC Managed Care – PPO | Source: Ambulatory Visit | Attending: Family Medicine | Admitting: Family Medicine

## 2022-06-23 VITALS — BP 151/97 | HR 73 | Temp 98.1°F | Resp 18 | Ht 68.0 in | Wt 240.0 lb

## 2022-06-23 DIAGNOSIS — M25511 Pain in right shoulder: Secondary | ICD-10-CM | POA: Diagnosis not present

## 2022-06-23 DIAGNOSIS — M19019 Primary osteoarthritis, unspecified shoulder: Secondary | ICD-10-CM | POA: Diagnosis not present

## 2022-06-23 MED ORDER — METHYLPREDNISOLONE 4 MG PO TBPK: ORAL_TABLET | ORAL | 0 refills | Status: DC

## 2022-06-23 MED ORDER — HYDROCODONE-ACETAMINOPHEN 5-325 MG PO TABS: 1.0000 | ORAL_TABLET | Freq: Four times a day (QID) | ORAL | 0 refills | Status: DC | PRN

## 2022-06-23 NOTE — Discharge Instructions (Signed)
Use ice to area Take the medrol as directed Take hydrocodone as needed for pain Do not drive on the hydrocodone See Dr Mel Almond for follow up

## 2022-06-23 NOTE — ED Triage Notes (Signed)
Patient c/o right shoulder pain x 2-3 days after raking leaves for several hours.  Patient was recently diagnosed DM and started new meds; Metformin and Atorvastatin, wondering if this could be a side effect of new meds.  Patient has applied ice and taken Ibuprofen 600mg .

## 2022-06-23 NOTE — ED Provider Notes (Signed)
[name]    CSN: [id number] Arrival date & time: 06/23/22  0850      History   Chief Complaint Chief Complaint  Patient presents with   Shoulder Pain    Intense shoulder pain in right shoulder - Entered by patient    HPI Carlos Potts is a 56 y.o. male.   HPI  Patient has a painful right shoulder.  He noticed yesterday.  He did a lot of raking of leaves.  He thinks it is from repetitive motion. He also states that he was recently started on metformin and atorvastatin.  He wonders whether there could be medication side effect.  The metformin is causing some diarrhea.  I encouraged him to call his primary doctor if this continues.  Past Medical History:  Diagnosis Date   Arthritis    FINGERS   Back ache    GERD (gastroesophageal reflux disease)    History of kidney stones    History of shingles 10/24/2018   08/2018   Seasonal allergies    Tinnitus     Patient Active Problem List   Diagnosis Date Noted   Acquired trigger finger of both index fingers 06/19/2022   Prediabetes 06/19/2022   Benign colon polyp - colonoscopy 11/2019 04/25/2021   Post herpetic neuralgia 10/24/2018   History of shingles 10/24/2018   Tinnitus of both ears 09/10/2017   Insomnia 09/10/2017   Kidney stone    Chalazion left lower eyelid 07/27/2016   Osteoarthritis of both hands 07/27/2016   Abnormal thyroid blood test 04/27/2016   Rhinitis, allergic 03/28/2016   Bilateral low back pain without sciatica 03/28/2016   Adult BMI > 30 03/28/2016    Past Surgical History:  Procedure Laterality Date   CYSTOSCOPY/URETEROSCOPY/HOLMIUM LASER/STENT PLACEMENT Left 08/23/2017   Procedure: CYSTOSCOPY/RETROGRADE/URETEROSCOPY/HOLMIUM LASER/STENT PLACEMENT;  Surgeon: Raynelle Bring, MD;  Location: [facility];  Service: Urology;  Laterality: Left;   EXTRACORPOREAL SHOCK WAVE LITHOTRIPSY Left 08/12/2017   Procedure: LEFT EXTRACORPOREAL SHOCK WAVE LITHOTRIPSY (ESWL);  Surgeon: Alexis Frock,  MD;  Location: [facility];  Service: Urology;  Laterality: Left;   STENT TO LEFT KINEY  08/17/2017   IN [place]   TONSILLECTOMY  AGE 51       Home Medications    Prior to Admission medications   Medication Sig Start Date End Date Taking? Authorizing Provider  atorvastatin (LIPITOR) 10 MG tablet Take 1 tablet (10 mg total) by mouth daily. 06/20/22  Yes Mel Almond, Erika S, DO  cyclobenzaprine (FLEXERIL) 5 MG tablet Take 1-2 tablets (5-10 mg total) by mouth 2 (two) times daily as needed for muscle spasms. 06/19/22  Yes Owens Loffler, DO  HYDROcodone-acetaminophen (NORCO/VICODIN) 5-325 MG tablet Take 1-2 tablets by mouth every 6 (six) hours as needed. 06/23/22  Yes Raylene Everts, MD  metFORMIN (GLUCOPHAGE) 500 MG tablet Take 1 tablet (500 mg total) by mouth 2 (two) times daily with a meal. 06/20/22  Yes Owens Loffler, DO  methylPREDNISolone (MEDROL DOSEPAK) 4 MG TBPK tablet tad 06/23/22  Yes Raylene Everts, MD    Family History Family History  Problem Relation Age of Onset   Cancer Father    Cancer Sister     Social History Social History   Tobacco Use   Smoking status: Never   Smokeless tobacco: Never  Vaping Use   Vaping Use: Never used  Substance Use Topics   Alcohol use: No   Drug use: No     Allergies   Patient has no  known allergies.   Review of Systems Review of Systems See HPI  Physical Exam Triage Vital Signs ED Triage Vitals  Enc Vitals Group     BP 06/23/22 0859 (!) 151/97     Pulse Rate 06/23/22 0859 73     Resp 06/23/22 0859 18     Temp 06/23/22 0859 98.1 F (36.7 C)     Temp Source 06/23/22 0859 Oral     SpO2 06/23/22 0859 96 %     Weight 06/23/22 0901 240 lb (108.9 kg)     Height 06/23/22 0901 5\' 8"  (1.727 m)     Head Circumference --      Peak Flow --      Pain Score 06/23/22 0900 5     Pain Loc --      Pain Edu? --      Excl. in GC? --    No data found.  Updated Vital Signs BP (!) 151/97 (BP Location: Left Arm)   Pulse  73   Temp 98.1 F (36.7 C) (Oral)   Resp 18   Ht 5\' 8"  (1.727 m)   Wt 108.9 kg   SpO2 96%   BMI 36.49 kg/m      Physical Exam Constitutional:      General: He is not in acute distress.    Appearance: He is well-developed.     Comments: Is moderately overweight.  Appears uncomfortable  HENT:     Head: Normocephalic and atraumatic.  Eyes:     Conjunctiva/sclera: Conjunctivae normal.     Pupils: Pupils are equal, round, and reactive to light.  Cardiovascular:     Rate and Rhythm: Normal rate.  Pulmonary:     Effort: Pulmonary effort is normal. No respiratory distress.  Abdominal:     General: There is no distension.     Palpations: Abdomen is soft.  Musculoskeletal:        General: Swelling and tenderness present. No deformity. Normal range of motion.     Cervical back: Normal range of motion.     Comments: The right shoulder has warmth.  Slight soft tissue swelling anteriorly.  Acute tenderness to even light pressure over the [facility] joint.  No tenderness lateral posterior joint.  Patient has very little range of motion of shoulder secondary to the acute pain, can only abduct or extend shoulder 10 to 15 degrees.  Pain with any rotation.  Skin:    General: Skin is warm and dry.  Neurological:     Mental Status: He is alert.  Psychiatric:        Mood and Affect: Mood normal.        Behavior: Behavior normal.      UC Treatments / Results  Labs (all labs ordered are listed, but only abnormal results are displayed) Labs Reviewed - No data to display  EKG   Radiology No results found.  Procedures Procedures (including critical care time)  Medications Ordered in UC Medications - No data to display  Initial Impression / Assessment and Plan / UC Course  I have reviewed the triage vital signs and the nursing notes.  Pertinent labs & imaging results that were available during my care of the patient were reviewed by me and considered in my medical decision making (see  chart for details).     Final Clinical Impressions(s) / UC Diagnoses   Final diagnoses:  Acute pain of right shoulder  AC joint arthropathy     Discharge Instructions  Use ice to area Take the medrol as directed Take hydrocodone as needed for pain Do not drive on the hydrocodone See Dr Tamera Punt for follow up    ED Prescriptions     Medication Sig Dispense Auth. Provider   methylPREDNISolone (MEDROL DOSEPAK) 4 MG TBPK tablet tad 21 tablet Eustace Moore, MD   HYDROcodone-acetaminophen (NORCO/VICODIN) 5-325 MG tablet Take 1-2 tablets by mouth every 6 (six) hours as needed. 10 tablet Eustace Moore, MD      I have reviewed the PDMP during this encounter.   Eustace Moore, MD 06/23/22 [phone]

## 2022-07-05 ENCOUNTER — Telehealth: Payer: Self-pay

## 2022-07-05 ENCOUNTER — Encounter: Payer: Self-pay | Admitting: Family Medicine

## 2022-07-16 ENCOUNTER — Encounter: Payer: Self-pay | Admitting: Family Medicine

## 2022-07-16 ENCOUNTER — Ambulatory Visit (INDEPENDENT_AMBULATORY_CARE_PROVIDER_SITE_OTHER): Payer: BC Managed Care – PPO | Admitting: Family Medicine

## 2022-07-16 VITALS — BP 131/82 | HR 73 | Ht 68.0 in | Wt 226.0 lb

## 2022-07-16 DIAGNOSIS — M7062 Trochanteric bursitis, left hip: Secondary | ICD-10-CM

## 2022-07-16 DIAGNOSIS — E669 Obesity, unspecified: Secondary | ICD-10-CM | POA: Diagnosis not present

## 2022-07-16 DIAGNOSIS — E1165 Type 2 diabetes mellitus with hyperglycemia: Secondary | ICD-10-CM | POA: Diagnosis not present

## 2022-07-16 DIAGNOSIS — R5383 Other fatigue: Secondary | ICD-10-CM | POA: Diagnosis not present

## 2022-07-16 DIAGNOSIS — M7061 Trochanteric bursitis, right hip: Secondary | ICD-10-CM | POA: Diagnosis not present

## 2022-07-16 MED ORDER — METHYLPREDNISOLONE 4 MG PO TBPK: ORAL_TABLET | ORAL | 0 refills | Status: DC

## 2022-07-16 MED ORDER — RYBELSUS 3 MG PO TABS: 3.0000 mg | ORAL_TABLET | Freq: Every day | ORAL | 3 refills | Status: DC

## 2022-07-16 MED ORDER — METFORMIN HCL ER 500 MG PO TB24: 500.0000 mg | ORAL_TABLET | Freq: Every day | ORAL | 3 refills | Status: DC

## 2022-07-16 MED ORDER — RYBELSUS 3 MG PO TABS: 1.0000 | ORAL_TABLET | Freq: Every day | ORAL | 0 refills | Status: AC

## 2022-07-16 NOTE — Progress Notes (Signed)
Established patient visit   Patient: Carlos Potts   DOB: 1966-06-21   56 y.o. Male  MRN: [id number] Visit Date: 07/16/2022  Today's healthcare provider: Charlton Amor, DO   Chief Complaint  Patient presents with   Follow-up    SUBJECTIVE    Chief Complaint  Patient presents with   Follow-up   HPI   Prediabetes: Pt has been doing well with diet and exercise control. He has lost around 20lbs by cutting out breads and added sugars. He is however, having difficulty exercising due to his trochanteric bursitis. He had diarrhea from metformin but is willing to try metformin XR.   He also has concerns of low testosterone and said his brother has low T. He is having issues with fatigue.   Review of Systems  Constitutional:  Negative for activity change, fatigue and fever.  Respiratory:  Negative for cough and shortness of breath.   Cardiovascular:  Negative for chest pain.  Gastrointestinal:  Negative for abdominal pain.  Genitourinary:  Negative for difficulty urinating.       Current Meds  Medication Sig   atorvastatin (LIPITOR) 10 MG tablet Take 1 tablet (10 mg total) by mouth daily.   cyclobenzaprine (FLEXERIL) 5 MG tablet Take 1-2 tablets (5-10 mg total) by mouth 2 (two) times daily as needed for muscle spasms.   metFORMIN (GLUCOPHAGE-XR) 500 MG 24 hr tablet Take 1 tablet (500 mg total) by mouth daily with breakfast.   Semaglutide (RYBELSUS) 3 MG TABS Take 3 mg by mouth daily.   Semaglutide (RYBELSUS) 3 MG TABS Take 1 tablet by mouth daily.    OBJECTIVE    BP 131/82   Pulse 73   Ht 5\' 8"  (1.727 m)   Wt 226 lb (102.5 kg)   SpO2 96%   BMI 34.36 kg/m   Physical Exam Vitals and nursing note reviewed.  Constitutional:      General: He is not in acute distress.    Appearance: Normal appearance.  HENT:     Head: Normocephalic and atraumatic.     Right Ear: External ear normal.     Left Ear: External ear normal.     Nose: Nose normal.  Eyes:      Conjunctiva/sclera: Conjunctivae normal.  Cardiovascular:     Rate and Rhythm: Normal rate and regular rhythm.  Pulmonary:     Effort: Pulmonary effort is normal.     Breath sounds: Normal breath sounds.  Neurological:     General: No focal deficit present.     Mental Status: He is alert and oriented to person, place, and time.  Psychiatric:        Mood and Affect: Mood normal.        Behavior: Behavior normal.        Thought Content: Thought content normal.        Judgment: Judgment normal.          ASSESSMENT & PLAN    Problem List Items Addressed This Visit       Endocrine   Type 2 diabetes mellitus with hyperglycemia, without long-term current use of insulin (HCC) - Primary    - have switched pt to metformin XR as he is wanting to give XR a try - have ordered rybelsus to help with A1C control as well as weight loss adjunct  - can repeat A1c in 3 months  - discussed diabetic diet and exercises  - have ordered lipid panel to assess cholesterol status  Relevant Medications   metFORMIN (GLUCOPHAGE-XR) 500 MG 24 hr tablet   Semaglutide (RYBELSUS) 3 MG TABS   Semaglutide (RYBELSUS) 3 MG TABS   Other Relevant Orders   Lipid panel     Musculoskeletal and Integument   Trochanteric bursitis of both hips    - due to thanksgiving holiday and him traveling will go ahead and give medrol dose pack to help calm down some inflammation as he will be more active these days - did discuss getting into see Dr T for further recommendations and discussion about steroid injection as he is willing to do a steroid injection but does not like needles.      Relevant Medications   methylPREDNISolone (MEDROL DOSEPAK) 4 MG TBPK tablet     Other   Other fatigue    - will go ahead and test for testosterone since pt notes low fatigue      Relevant Orders   Testosterone , Free and Total   Obesity (BMI 30.0-34.9)    Return in about 3 months (around 10/16/2022).      Meds ordered this  encounter  Medications   metFORMIN (GLUCOPHAGE-XR) 500 MG 24 hr tablet    Sig: Take 1 tablet (500 mg total) by mouth daily with breakfast.    Dispense:  90 tablet    Refill:  3   Semaglutide (RYBELSUS) 3 MG TABS    Sig: Take 3 mg by mouth daily.    Dispense:  30 tablet    Refill:  3   Semaglutide (RYBELSUS) 3 MG TABS    Sig: Take 1 tablet by mouth daily.    Dispense:  30 tablet    Refill:  0    Use discount coupon.   methylPREDNISolone (MEDROL DOSEPAK) 4 MG TBPK tablet    Sig: tad    Dispense:  21 tablet    Refill:  0    Orders Placed This Encounter  Procedures   Testosterone , Free and Total   Lipid panel    Order Specific Question:   Has the patient fasted?    Answer:   No    Order Specific Question:   Release to patient    Answer:   Immediate     Charlton Amor, DO  [facility] [phone] (phone) [phone] (fax)  [facility]

## 2022-07-16 NOTE — Patient Instructions (Signed)
For testosterone testing, come to clinic before 10am

## 2022-07-16 NOTE — Assessment & Plan Note (Signed)
-   will go ahead and test for testosterone since pt notes low fatigue

## 2022-07-16 NOTE — Assessment & Plan Note (Addendum)
-   have switched pt to metformin XR as he is wanting to give XR a try - have ordered rybelsus to help with A1C control as well as weight loss adjunct  - can repeat A1c in 3 months  - discussed diabetic diet and exercises  - have ordered lipid panel to assess cholesterol status

## 2022-07-16 NOTE — Assessment & Plan Note (Signed)
-   due to thanksgiving holiday and him traveling will go ahead and give medrol dose pack to help calm down some inflammation as he will be more active these days - did discuss getting into see Dr T for further recommendations and discussion about steroid injection as he is willing to do a steroid injection but does not like needles.

## 2022-07-23 ENCOUNTER — Telehealth: Payer: Self-pay

## 2022-07-23 NOTE — Telephone Encounter (Signed)
Initiated Prior authorization [id number] 3MG  tablets Via: Covermymeds Case/Key:[id number] Status: approved as of 07/23/22 Reason:Effective from 07/23/2022 through 07/22/2023. Notified Pt via: Mychart

## 2022-07-31 ENCOUNTER — Ambulatory Visit: Payer: BC Managed Care – PPO | Admitting: Sports Medicine

## 2022-07-31 DIAGNOSIS — M7542 Impingement syndrome of left shoulder: Secondary | ICD-10-CM

## 2022-07-31 DIAGNOSIS — M7541 Impingement syndrome of right shoulder: Secondary | ICD-10-CM | POA: Insufficient documentation

## 2022-07-31 DIAGNOSIS — M7062 Trochanteric bursitis, left hip: Secondary | ICD-10-CM

## 2022-07-31 DIAGNOSIS — M7061 Trochanteric bursitis, right hip: Secondary | ICD-10-CM

## 2022-07-31 NOTE — Assessment & Plan Note (Signed)
Bilateral left worse than right rotator cuff impingement syndrome, discussed the anatomy and pathophysiology, and cuff conditioning, return to see me in 6 weeks.

## 2022-07-31 NOTE — Progress Notes (Signed)
    Procedures performed today:    None.  Independent interpretation of notes and tests performed by another provider:   None.  Brief History, Exam, Impression, and Recommendations:    Trochanteric bursitis of both hips This is a very pleasant 56 year old male, he had several weeks of right lateral hip pain, this occurred after a long power walk. On exam he has tenderness greater trochanter on the right with weakness of hip abductor strength, we discussed the anatomy and pathophysiology, he will do some hip abductor conditioning and return to see me in 6 weeks if needed.  Impingement syndrome of both shoulders Bilateral left worse than right rotator cuff impingement syndrome, discussed the anatomy and pathophysiology, and cuff conditioning, return to see me in 6 weeks.    ____________________________________________ Ihor Austin. Benjamin Stain, M.D., ABFM., CAQSM., AME. Primary Care and Sports Medicine [facility]  Adjunct Professor of Family Medicine  [place] [place] [employer]  Restaurant manager, fast food

## 2022-07-31 NOTE — Patient Instructions (Signed)
Hip Rehabilitation Protocol:  1.  Side leg raises.  3x30 with no weight, then 3x15 with 2 lb ankle weight, then 3x15 with 5 lb ankle weight 

## 2022-07-31 NOTE — Assessment & Plan Note (Signed)
This is a very pleasant 56 year old male, he had several weeks of right lateral hip pain, this occurred after a long power walk. On exam he has tenderness greater trochanter on the right with weakness of hip abductor strength, we discussed the anatomy and pathophysiology, he will do some hip abductor conditioning and return to see me in 6 weeks if needed.

## 2022-08-03 NOTE — Telephone Encounter (Signed)
completed

## 2022-09-02 DIAGNOSIS — J020 Streptococcal pharyngitis: Secondary | ICD-10-CM | POA: Diagnosis not present

## 2022-09-04 ENCOUNTER — Other Ambulatory Visit: Payer: Self-pay | Admitting: Family Medicine

## 2022-09-04 ENCOUNTER — Telehealth: Payer: Self-pay

## 2022-09-04 MED ORDER — RYBELSUS 7 MG PO TABS: 7.0000 mg | ORAL_TABLET | Freq: Every day | ORAL | 1 refills | Status: DC

## 2022-09-04 NOTE — Telephone Encounter (Signed)
Rx sent in

## 2022-09-11 ENCOUNTER — Ambulatory Visit: Payer: BC Managed Care – PPO | Admitting: Sports Medicine

## 2022-10-01 NOTE — Telephone Encounter (Signed)
There is already refill on the drug for Februarly.

## 2022-10-01 NOTE — Telephone Encounter (Signed)
Patient is requesting refills on rybeliubs 7 mg be sent to [facility] on [address]

## 2022-10-03 ENCOUNTER — Encounter: Payer: Self-pay | Admitting: Family Medicine

## 2022-10-03 ENCOUNTER — Telehealth: Payer: BC Managed Care – PPO | Admitting: Family Medicine

## 2022-10-03 VITALS — Ht 68.0 in | Wt 225.0 lb

## 2022-10-03 DIAGNOSIS — U071 COVID-19: Secondary | ICD-10-CM

## 2022-10-03 MED ORDER — NIRMATRELVIR/RITONAVIR (PAXLOVID)TABLET: 3.0000 | ORAL_TABLET | Freq: Two times a day (BID) | ORAL | 0 refills | Status: AC

## 2022-10-03 NOTE — Progress Notes (Signed)
Started 10/02/22: throat disturbance, dry heaving, sniffles, coughing. Tested today.  Took Ibuprofen. Headache yesterday. Has resolved.

## 2022-10-03 NOTE — Assessment & Plan Note (Addendum)
His symptoms are fairly mild at this time.  He did receive COVID booster a few months ago.  We discussed Paxlovid and I don't think that he needs this now but he would like for me to send this in for him to start if symptoms do worsen over the next couple of days.  Recommend supportive care with increased fluid intake and may use OTC medications as needed for symptom management. Contact clinic if symptoms worsen, red flags reviewed.

## 2022-10-03 NOTE — Progress Notes (Signed)
Carlos Potts - 57 y.o. male MRN [id number]  Date of birth: [birth year]   This visit type was conducted due to national recommendations for restrictions regarding the COVID-19 Pandemic (e.g. social distancing).  This format is felt to be most appropriate for this patient at this time.  All issues noted in this document were discussed and addressed.  No physical exam was performed (except for noted visual exam findings with Video Visits).  I discussed the limitations of evaluation and management by telemedicine and the availability of in person appointments. The patient expressed understanding and agreed to proceed.  I connected withNAME@ on 10/03/22 at  1:10 PM EST by a video enabled telemedicine application and verified that I am speaking with the correct person using two identifiers.  Present at visit: Luetta Nutting, DO Illene Labrador   Patient Location: Home [address] [place] [address]   Provider location:   [facility]  Chief Complaint  Patient presents with   Covid Positive    HPI  Carlos Potts is a 57 y.o. male who presents via audio/video conferencing for a telehealth visit today.  He reports that he began having symptoms of sore throat, congestion and headache yesterday.  Tested for COVID today and was positive.  Has not had fever, chills, significant respiratory symptoms, or GI symptoms.  He is drinking a good amount of fluids.  He has questions about possibly adding paxlovid.     ROS:  A comprehensive ROS was completed and negative except as noted per HPI  Past Medical History:  Diagnosis Date   Arthritis    FINGERS   Back ache    GERD (gastroesophageal reflux disease)    History of kidney stones    History of shingles 10/24/2018   08/2018   Seasonal allergies    Tinnitus     Past Surgical History:  Procedure Laterality Date   CYSTOSCOPY/URETEROSCOPY/HOLMIUM LASER/STENT PLACEMENT Left 08/23/2017   Procedure:  CYSTOSCOPY/RETROGRADE/URETEROSCOPY/HOLMIUM LASER/STENT PLACEMENT;  Surgeon: Raynelle Bring, MD;  Location: [facility];  Service: Urology;  Laterality: Left;   EXTRACORPOREAL SHOCK WAVE LITHOTRIPSY Left 08/12/2017   Procedure: LEFT EXTRACORPOREAL SHOCK WAVE LITHOTRIPSY (ESWL);  Surgeon: Alexis Frock, MD;  Location: [facility];  Service: Urology;  Laterality: Left;   STENT TO LEFT KINEY  08/17/2017   IN [place]   TONSILLECTOMY  AGE 61    Family History  Problem Relation Age of Onset   Cancer Father    Cancer Sister     Social History   Socioeconomic History   Marital status: Married    Spouse name: Not on file   Number of children: Not on file   Years of education: Not on file   Highest education level: Not on file  Occupational History   Not on file  Tobacco Use   Smoking status: Never   Smokeless tobacco: Never  Vaping Use   Vaping Use: Never used  Substance and Sexual Activity   Alcohol use: No   Drug use: No   Sexual activity: Not on file  Other Topics Concern   Not on file  Social History Narrative   Not on file   Social Determinants of Health   Financial Resource Strain: Not on file  Food Insecurity: Not on file  Transportation Needs: Not on file  Physical Activity: Not on file  Stress: Not on file  Social Connections: Not on file  Intimate Partner Violence: Not on file     Current Outpatient Medications:    nirmatrelvir/ritonavir (  PAXLOVID) 20 x 150 MG & 10 x 100MG  TABS, Take 3 tablets by mouth 2 (two) times daily for 5 days. (Take nirmatrelvir 150 mg two tablets twice daily for 5 days and ritonavir 100 mg one tablet twice daily for 5 days) Patient GFR is 100, Disp: 30 tablet, Rfl: 0   Semaglutide (RYBELSUS) 7 MG TABS, Take 7 mg by mouth daily., Disp: 30 tablet, Rfl: 1   atorvastatin (LIPITOR) 10 MG tablet, Take 1 tablet (10 mg total) by mouth daily., Disp: 90 tablet, Rfl: 3   cyclobenzaprine (FLEXERIL) 5 MG tablet, Take 1-2 tablets (5-10 mg total) by  mouth 2 (two) times daily as needed for muscle spasms., Disp: 30 tablet, Rfl: 0   metFORMIN (GLUCOPHAGE-XR) 500 MG 24 hr tablet, Take 1 tablet (500 mg total) by mouth daily with breakfast., Disp: 90 tablet, Rfl: 3  EXAM:  VITALS per patient if applicable: Ht 5\' 8"  (1.727 m)   Wt 225 lb (102.1 kg)   BMI 34.21 kg/m   GENERAL: alert, oriented, appears well and in no acute distress  HEENT: atraumatic, conjunttiva clear, no obvious abnormalities on inspection of external nose and ears  NECK: normal movements of the head and neck  LUNGS: on inspection no signs of respiratory distress, breathing rate appears normal, no obvious gross SOB, gasping or wheezing  CV: no obvious cyanosis  MS: moves all visible extremities without noticeable abnormality  PSYCH/NEURO: pleasant and cooperative, no obvious depression or anxiety, speech and thought processing grossly intact  ASSESSMENT AND PLAN:  Discussed the following assessment and plan:  COVID His symptoms are fairly mild at this time.  He did receive COVID booster a few months ago.  We discussed Paxlovid and I don't think that he needs this now but he would like for me to send this in for him to start if symptoms do worsen over the next couple of days.  Recommend supportive care with increased fluid intake and may use OTC medications as needed for symptom management. Contact clinic if symptoms worsen, red flags reviewed.       I discussed the assessment and treatment plan with the patient. The patient was provided an opportunity to ask questions and all were answered. The patient agreed with the plan and demonstrated an understanding of the instructions.   The patient was advised to call back or seek an in-person evaluation if the symptoms worsen or if the condition fails to improve as anticipated.    Luetta Nutting, DO

## 2022-10-04 ENCOUNTER — Telehealth: Payer: Self-pay

## 2022-10-04 NOTE — Telephone Encounter (Signed)
Patient called to request authorization for Rybelsus 7mg  he has 2 pills left he used [facility] on [address] [facility]

## 2022-10-05 ENCOUNTER — Telehealth: Payer: Self-pay

## 2022-10-05 NOTE — Telephone Encounter (Signed)
Initiated Prior authorization [id number] 7MG tablets Via: Covermymeds Case/Key:[id number] Status: Pending as of 10/05/22 Reason: Notified Pt via: Mychart

## 2022-10-08 ENCOUNTER — Other Ambulatory Visit: Payer: Self-pay

## 2022-10-08 DIAGNOSIS — R5383 Other fatigue: Secondary | ICD-10-CM

## 2022-10-08 DIAGNOSIS — E1165 Type 2 diabetes mellitus with hyperglycemia: Secondary | ICD-10-CM

## 2022-10-08 DIAGNOSIS — E7800 Pure hypercholesterolemia, unspecified: Secondary | ICD-10-CM

## 2022-10-25 ENCOUNTER — Telehealth: Payer: Self-pay

## 2022-10-25 DIAGNOSIS — R5383 Other fatigue: Secondary | ICD-10-CM

## 2022-10-25 NOTE — Telephone Encounter (Signed)
Lab order

## 2022-10-26 LAB — TESTOSTERONE TOTAL,FREE,BIO, MALES
Albumin: 4.5 g/dL (ref 3.6–5.1)
Sex Hormone Binding: 35 nmol/L (ref 22–77)
Testosterone, Bioavailable: 146.1 ng/dL (ref 110.0–575.0)
Testosterone, Free: 71 pg/mL (ref 46.0–224.0)
Testosterone: 545 ng/dL (ref 250–827)

## 2022-11-05 ENCOUNTER — Encounter: Payer: Self-pay | Admitting: Family Medicine

## 2022-11-05 ENCOUNTER — Ambulatory Visit: Payer: BC Managed Care – PPO | Admitting: Family Medicine

## 2022-11-05 VITALS — BP 117/81 | HR 84 | Ht 68.0 in | Wt 189.0 lb

## 2022-11-05 DIAGNOSIS — R5383 Other fatigue: Secondary | ICD-10-CM

## 2022-11-05 DIAGNOSIS — E1165 Type 2 diabetes mellitus with hyperglycemia: Secondary | ICD-10-CM | POA: Diagnosis not present

## 2022-11-05 LAB — POCT GLYCOSYLATED HEMOGLOBIN (HGB A1C): Hemoglobin A1C: 5.4 % (ref 4.0–5.6)

## 2022-11-05 NOTE — Assessment & Plan Note (Addendum)
-   POC A1c 5.4 will hold rybelsus for now and recheck in 3 months - will hold metformin as well  - congratulated patient on A1c lowering  - pt will increase exercise--encouraged biking and aquatics since hx of trochanteric bursitis  - discussed increasing protein foods  - diabetic foot exam performed and normal today

## 2022-11-05 NOTE — Progress Notes (Signed)
Established patient visit   Patient: Carlos Potts   DOB: 11/19/1965   57 y.o. Male  MRN: [id number] Visit Date: 11/05/2022  Today's healthcare provider: Owens Loffler, DO   Chief Complaint  Patient presents with   Follow-up    Follow up on test results    SUBJECTIVE    Chief Complaint  Patient presents with   Follow-up    Follow up on test results   HPI HPI     Follow-up    Additional comments: Follow up on test results      Last edited by Myrlene Broker, [place] on 11/05/2022  8:23 AM.      Pt presents for follow up for diabetes. He is currently on rybelsus '7mg'$  and has been tolerating medication well. Last A1c was 6.7. He is due for A1c check today.   Review of Systems  Constitutional:  Negative for activity change, fatigue and fever.  Respiratory:  Negative for cough and shortness of breath.   Cardiovascular:  Negative for chest pain.  Gastrointestinal:  Negative for abdominal pain.  Genitourinary:  Negative for difficulty urinating.       Current Meds  Medication Sig   atorvastatin (LIPITOR) 10 MG tablet Take 1 tablet (10 mg total) by mouth daily.   cyclobenzaprine (FLEXERIL) 5 MG tablet Take 1-2 tablets (5-10 mg total) by mouth 2 (two) times daily as needed for muscle spasms.   metFORMIN (GLUCOPHAGE-XR) 500 MG 24 hr tablet Take 1 tablet (500 mg total) by mouth daily with breakfast.   Semaglutide (RYBELSUS) 7 MG TABS Take 7 mg by mouth daily.    OBJECTIVE    BP 117/81 (BP Location: Left Arm, Patient Position: Sitting, Cuff Size: Normal)   Pulse 84   Ht '5\' 8"'$  (1.727 m)   Wt 189 lb 0.6 oz (85.7 kg)   SpO2 98%   BMI 28.74 kg/m   Physical Exam Vitals and nursing note reviewed.  Constitutional:      General: He is not in acute distress.    Appearance: Normal appearance.  HENT:     Head: Normocephalic and atraumatic.     Right Ear: External ear normal.     Left Ear: External ear normal.     Nose: Nose normal.  Eyes:     Conjunctiva/sclera:  Conjunctivae normal.  Cardiovascular:     Rate and Rhythm: Normal rate and regular rhythm.  Pulmonary:     Effort: Pulmonary effort is normal.     Breath sounds: Normal breath sounds.  Neurological:     General: No focal deficit present.     Mental Status: He is alert and oriented to person, place, and time.  Psychiatric:        Mood and Affect: Mood normal.        Behavior: Behavior normal.        Thought Content: Thought content normal.        Judgment: Judgment normal.          ASSESSMENT & PLAN    Problem List Items Addressed This Visit       Endocrine   Type 2 diabetes mellitus with hyperglycemia, without long-term current use of insulin (HCC) - Primary    - POC A1c 5.4 will hold rybelsus for now and recheck in 3 months - will hold metformin as well  - congratulated patient on A1c lowering  - pt will increase exercise--encouraged biking and aquatics since hx of trochanteric bursitis  - discussed increasing  protein foods  - diabetic foot exam performed and normal today       Relevant Orders   POCT HgB A1C     Other   Other fatigue    If fatigue persists we can get a cbc and/or tsh--likely fatigue related to covid and viral illness in February in addition to added weight gain   - testosterone labs were discussed and were normal.         Return in about 3 months (around 02/05/2023).      No orders of the defined types were placed in this encounter.   Orders Placed This Encounter  Procedures   POCT HgB A1C     Owens Loffler, [place] at [facility] [phone] (phone) [phone] (fax)  [place]

## 2022-11-05 NOTE — Patient Instructions (Signed)
Stop rybelsus   Stop metformin   We will recheck in 3 months   Congrats on lowering your A1C--AMAZING JOB!!!!

## 2022-11-05 NOTE — Assessment & Plan Note (Signed)
If fatigue persists we can get a cbc and/or tsh--likely fatigue related to covid and viral illness in February in addition to added weight gain   - testosterone labs were discussed and were normal.

## 2022-12-11 ENCOUNTER — Other Ambulatory Visit: Payer: Self-pay | Admitting: Family Medicine

## 2022-12-11 MED ORDER — CYCLOBENZAPRINE HCL 5 MG PO TABS: 5.0000 mg | ORAL_TABLET | Freq: Two times a day (BID) | ORAL | 0 refills | Status: DC | PRN

## 2022-12-11 NOTE — Telephone Encounter (Signed)
Pt requesting a refill on cyclobenzaprine (FLEXERIL) 5 MG tablet  sent to [facility] in [employer] on [address]

## 2022-12-11 NOTE — Telephone Encounter (Signed)
Pended prescription

## 2023-01-22 ENCOUNTER — Ambulatory Visit
Admission: EM | Admit: 2023-01-22 | Discharge: 2023-01-22 | Disposition: A | Discharge status: Home / Self Care | Payer: BC Managed Care – PPO | Attending: Family Medicine | Admitting: Family Medicine

## 2023-01-22 ENCOUNTER — Ambulatory Visit (INDEPENDENT_AMBULATORY_CARE_PROVIDER_SITE_OTHER): Payer: BC Managed Care – PPO

## 2023-01-22 DIAGNOSIS — Z87442 Personal history of urinary calculi: Secondary | ICD-10-CM | POA: Diagnosis not present

## 2023-01-22 DIAGNOSIS — Z7984 Long term (current) use of oral hypoglycemic drugs: Secondary | ICD-10-CM | POA: Diagnosis not present

## 2023-01-22 DIAGNOSIS — Z8616 Personal history of COVID-19: Secondary | ICD-10-CM | POA: Insufficient documentation

## 2023-01-22 DIAGNOSIS — I700 Atherosclerosis of aorta: Secondary | ICD-10-CM | POA: Insufficient documentation

## 2023-01-22 DIAGNOSIS — M5136 Other intervertebral disc degeneration, lumbar region: Secondary | ICD-10-CM | POA: Diagnosis not present

## 2023-01-22 DIAGNOSIS — E119 Type 2 diabetes mellitus without complications: Secondary | ICD-10-CM | POA: Insufficient documentation

## 2023-01-22 DIAGNOSIS — R319 Hematuria, unspecified: Secondary | ICD-10-CM

## 2023-01-22 DIAGNOSIS — R109 Unspecified abdominal pain: Secondary | ICD-10-CM | POA: Diagnosis not present

## 2023-01-22 DIAGNOSIS — N132 Hydronephrosis with renal and ureteral calculous obstruction: Secondary | ICD-10-CM | POA: Insufficient documentation

## 2023-01-22 DIAGNOSIS — N50811 Right testicular pain: Secondary | ICD-10-CM | POA: Diagnosis not present

## 2023-01-22 DIAGNOSIS — N1330 Unspecified hydronephrosis: Secondary | ICD-10-CM

## 2023-01-22 DIAGNOSIS — Z6830 Body mass index (BMI) 30.0-30.9, adult: Secondary | ICD-10-CM | POA: Diagnosis not present

## 2023-01-22 DIAGNOSIS — M5137 Other intervertebral disc degeneration, lumbosacral region: Secondary | ICD-10-CM | POA: Insufficient documentation

## 2023-01-22 DIAGNOSIS — M545 Low back pain, unspecified: Secondary | ICD-10-CM | POA: Diagnosis not present

## 2023-01-22 DIAGNOSIS — E669 Obesity, unspecified: Secondary | ICD-10-CM | POA: Insufficient documentation

## 2023-01-22 DIAGNOSIS — R110 Nausea: Secondary | ICD-10-CM

## 2023-01-22 LAB — POCT URINALYSIS DIP (MANUAL ENTRY)
Bilirubin, UA: NEGATIVE
Glucose, UA: NEGATIVE mg/dL
Ketones, POC UA: NEGATIVE mg/dL
Leukocytes, UA: NEGATIVE
Nitrite, UA: NEGATIVE
Protein Ur, POC: 100 mg/dL — AB
Spec Grav, UA: >=1.03 — AB
Urobilinogen, UA: 0.2 U/dL
pH, UA: 5.5

## 2023-01-22 MED ORDER — KETOROLAC TROMETHAMINE 60 MG/2ML IM SOLN
60.0000 mg | Freq: Once | INTRAMUSCULAR | Status: AC
  Administered 2023-01-22: 60 mg via INTRAMUSCULAR

## 2023-01-22 MED ORDER — TAMSULOSIN HCL 0.4 MG PO CAPS: 0.4000 mg | ORAL_CAPSULE | Freq: Every day | ORAL | 0 refills | Status: AC

## 2023-01-22 MED ORDER — KETOROLAC TROMETHAMINE 10 MG PO TABS: 10.0000 mg | ORAL_TABLET | Freq: Four times a day (QID) | ORAL | 0 refills | Status: AC | PRN

## 2023-01-22 MED ORDER — ONDANSETRON 8 MG PO TBDP: 8.0000 mg | ORAL_TABLET | Freq: Three times a day (TID) | ORAL | 0 refills | Status: DC | PRN

## 2023-01-22 MED ORDER — ONDANSETRON 8 MG PO TBDP
8.0000 mg | ORAL_TABLET | Freq: Once | ORAL | Status: AC
  Administered 2023-01-22: 8 mg via ORAL

## 2023-01-22 NOTE — ED Triage Notes (Addendum)
Pt c/o RT testicular pain sice 9am this am. Also c/o lower RT sided back pain. Pain 4/10 Hx of kidney stones.

## 2023-01-22 NOTE — ED Provider Notes (Signed)
[facility]    CSN: [id number] Arrival date & time: 01/22/23  1048      History   Chief Complaint Chief Complaint  Patient presents with   Back Pain    Lower   Testicle Pain    RT    HPI Carlos Potts is a 57 y.o. male.   HPI 57 year old male presents with right testicular pain since 9 AM this morning complaining of right-sided low back pain pain for 10.  Reports history of kidney stones.  PMH significant for obesity, kidney stones, and T2DM with hyperglycemia.  Past Medical History:  Diagnosis Date   Arthritis    FINGERS   Back ache    GERD (gastroesophageal reflux disease)    History of kidney stones    History of shingles 10/24/2018   08/2018   Seasonal allergies    Tinnitus     Patient Active Problem List   Diagnosis Date Noted   COVID 10/03/2022   Impingement syndrome of both shoulders 07/31/2022   Type 2 diabetes mellitus with hyperglycemia, without long-term current use of insulin (HCC) 07/16/2022   Other fatigue 07/16/2022   Obesity (BMI 30.0-34.9) 07/16/2022   Trochanteric bursitis of both hips 07/16/2022   Acquired trigger finger of both index fingers 06/19/2022   Prediabetes 06/19/2022   Benign colon polyp - colonoscopy 11/2019 04/25/2021   Post herpetic neuralgia 10/24/2018   History of shingles 10/24/2018   Bilateral high frequency sensorineural hearing loss 09/20/2017   Tinnitus of both ears 09/10/2017   Insomnia 09/10/2017   Kidney stone    Chalazion left lower eyelid 07/27/2016   Osteoarthritis of both hands 07/27/2016   Rhinitis, allergic 03/28/2016   Bilateral low back pain without sciatica 03/28/2016   Adult BMI > 30 03/28/2016    Past Surgical History:  Procedure Laterality Date   CYSTOSCOPY/URETEROSCOPY/HOLMIUM LASER/STENT PLACEMENT Left 08/23/2017   Procedure: CYSTOSCOPY/RETROGRADE/URETEROSCOPY/HOLMIUM LASER/STENT PLACEMENT;  Surgeon: Heloise Purpura, MD;  Location: [facility];  Service: Urology;  Laterality: Left;    EXTRACORPOREAL SHOCK WAVE LITHOTRIPSY Left 08/12/2017   Procedure: LEFT EXTRACORPOREAL SHOCK WAVE LITHOTRIPSY (ESWL);  Surgeon: Sebastian Ache, MD;  Location: [facility];  Service: Urology;  Laterality: Left;   STENT TO LEFT KINEY  08/17/2017   IN [place]   TONSILLECTOMY  AGE 56       Home Medications    Prior to Admission medications   Medication Sig Start Date End Date Taking? Authorizing Provider  ketorolac (TORADOL) 10 MG tablet Take 1 tablet (10 mg total) by mouth every 6 (six) hours as needed. 01/22/23  Yes Trevor Iha, FNP  ondansetron (ZOFRAN-ODT) 8 MG disintegrating tablet Take 1 tablet (8 mg total) by mouth every 8 (eight) hours as needed for nausea or vomiting. 01/22/23  Yes Trevor Iha, FNP  tamsulosin (FLOMAX) 0.4 MG CAPS capsule Take 1 capsule (0.4 mg total) by mouth daily. 01/22/23  Yes Trevor Iha, FNP  atorvastatin (LIPITOR) 10 MG tablet Take 1 tablet (10 mg total) by mouth daily. 06/20/22   Charlton Amor, DO  cyclobenzaprine (FLEXERIL) 5 MG tablet Take 1-2 tablets (5-10 mg total) by mouth 2 (two) times daily as needed for muscle spasms. 12/11/22   Charlton Amor, DO  metFORMIN (GLUCOPHAGE-XR) 500 MG 24 hr tablet Take 1 tablet (500 mg total) by mouth daily with breakfast. 07/16/22   Charlton Amor, DO  Semaglutide (RYBELSUS) 7 MG TABS Take 7 mg by mouth daily. 09/04/22   Everrett Coombe, DO    Family History  Family History  Problem Relation Age of Onset   Cancer Father    Cancer Sister     Social History Social History   Tobacco Use   Smoking status: Never   Smokeless tobacco: Never  Vaping Use   Vaping Use: Never used  Substance Use Topics   Alcohol use: No   Drug use: No     Allergies   Metformin and related   Review of Systems Review of Systems  All other systems reviewed and are negative.    Physical Exam Triage Vital Signs ED Triage Vitals  Enc Vitals Group     BP 01/22/23 1113 (!) 159/80     Pulse Rate 01/22/23 1113 80      Resp 01/22/23 1113 17     Temp 01/22/23 1113 97.6 F (36.4 C)     Temp Source 01/22/23 1113 Oral     SpO2 01/22/23 1113 100 %     Weight --      Height --      Head Circumference --      Peak Flow --      Pain Score 01/22/23 1114 4     Pain Loc --      Pain Edu? --      Excl. in GC? --    No data found.  Updated Vital Signs BP (!) 159/80 (BP Location: Right Arm)   Pulse 80   Temp 97.6 F (36.4 C) (Oral)   Resp 17   SpO2 100%   Visual Acuity Right Eye Distance:   Left Eye Distance:   Bilateral Distance:    Right Eye Near:   Left Eye Near:    Bilateral Near:     Physical Exam Vitals and nursing note reviewed.  Constitutional:      Appearance: Normal appearance. He is obese. He is ill-appearing.  HENT:     Head: Normocephalic and atraumatic.     Mouth/Throat:     Mouth: Mucous membranes are moist.     Pharynx: Oropharynx is clear.  Eyes:     Extraocular Movements: Extraocular movements intact.     Conjunctiva/sclera: Conjunctivae normal.     Pupils: Pupils are equal, round, and reactive to light.  Cardiovascular:     Rate and Rhythm: Normal rate and regular rhythm.     Pulses: Normal pulses.     Heart sounds: Normal heart sounds.  Pulmonary:     Effort: Pulmonary effort is normal.     Breath sounds: Normal breath sounds. No wheezing, rhonchi or rales.  Musculoskeletal:        General: Normal range of motion.     Cervical back: Normal range of motion and neck supple.  Skin:    General: Skin is warm and dry.  Neurological:     General: No focal deficit present.     Mental Status: He is alert and oriented to person, place, and time. Mental status is at baseline.  Psychiatric:        Mood and Affect: Mood normal.        Behavior: Behavior normal.        Thought Content: Thought content normal.      UC Treatments / Results  Labs (all labs ordered are listed, but only abnormal results are displayed) Labs Reviewed  POCT URINALYSIS DIP (MANUAL ENTRY) -  Abnormal; Notable for the following components:      Result Value   Color, UA red (*)    Clarity, UA cloudy (*)  Spec Grav, UA >=1.030 (*)    Blood, UA large (*)    Protein Ur, POC =100 (*)    All other components within normal limits    EKG   Radiology CT Renal Stone Study  Result Date: 01/22/2023 CLINICAL DATA:  Abdominal/flank pain, stone suspected. EXAM: CT ABDOMEN AND PELVIS WITHOUT CONTRAST TECHNIQUE: Multidetector CT imaging of the abdomen and pelvis was performed following the standard protocol without IV contrast. RADIATION DOSE REDUCTION: This exam was performed according to the departmental dose-optimization program which includes automated exposure control, adjustment of the mA and/or kV according to patient size and/or use of iterative reconstruction technique. COMPARISON:  CT examination dated August 12, 2017 FINDINGS: Lower chest: No acute abnormality. Hepatobiliary: No focal liver abnormality is seen. No gallstones, gallbladder wall thickening, or biliary dilatation. Pancreas: Unremarkable. No pancreatic ductal dilatation or surrounding inflammatory changes. Spleen: Normal in size without focal abnormality. Adrenals/Urinary Tract: Adrenal glands are unremarkable. Mild right hydroureteronephrosis secondary to a 2 mm calculus in the proximal right ureter. There is partial duplication of the collecting system on the right, the stone is distal to the union of the proximal ureters. 2 and 3 mm calculi in the lower pole and interpolar region of the left kidney. Left ureter is normal. Bladder is unremarkable. Stomach/Bowel: Stomach is within normal limits. Appendix appears normal. No evidence of bowel wall thickening, distention, or inflammatory changes. Vascular/Lymphatic: Mild aortic atherosclerosis. No enlarged abdominal or pelvic lymph nodes. Reproductive: Prostate is unremarkable. Other: No abdominal wall hernia or abnormality. No abdominopelvic ascites. Musculoskeletal: Mild  degenerate disc disease of the lumbar spine prominent at L5-S1. IMPRESSION: 1. Mild right hydroureteronephrosis secondary to a 2 mm calculus in the proximal right ureter. 2. 2 and 3 mm calculi in the lower pole and interpolar region of the left kidney. 3. Left ureter is normal. 4. Bowel loops are normal in caliber. Normal appendix. No evidence of colitis or diverticulitis. 5. Mild degenerate disc disease of the lumbar spine prominent at L5-S1. Aortic Atherosclerosis (ICD10-I70.0). Electronically Signed   By: Larose Hires D.O.   On: 01/22/2023 12:27    Procedures Procedures (including critical care time)  Medications Ordered in UC Medications  ketorolac (TORADOL) injection 60 mg (60 mg Intramuscular Given 01/22/23 1153)  ondansetron (ZOFRAN-ODT) disintegrating tablet 8 mg (8 mg Oral Given 01/22/23 1154)    Initial Impression / Assessment and Plan / UC Course  I have reviewed the triage vital signs and the nursing notes.  Pertinent labs & imaging results that were available during my care of the patient were reviewed by me and considered in my medical decision making (see chart for details).     MDM: 1.  Right flank pain-Toradol 60 mg given IM once in clinic and prior to CT of renal pelvis, CT reveals above, Rx'd Toradol 10 mg tablet every 6 hours for right flank pain; 2.  History of kidney stone-CT renal study reveals above; 2 mm calculus in proximal right ureter with 2 and 3 mm calculus in the lower and interpolar region of left kidney, Rx Flomax 0.5 mg capsule daily; 3.  Hematuria, unspecified type-UA revealed above; 4.  Nausea-Zofran 8 mg given once in clinic and prior to CT of renal pelvis, Rx'd Zofran 8 mg 3 times daily, as needed for nausea.  5.  Hydroureteronephrosis-CT of renal pelvis revealed above advised patient of CT renal results with hardcopy provided to patient.  Advised patient to take Flomax daily in the morning.  Encouraged increase in daily  water intake to 64 ounces per day.   Advised may use Toradol for flank pain/ureteral/kidney stone pain every 6 hours.  Advised may use Zofran as needed for nausea.  Advised patient if symptoms worsen and/or unresolved please follow-up with PCP, [facility] urology or here for further evaluation.  Patient discharged home, hemodynamically stable. Final Clinical Impressions(s) / UC Diagnoses   Final diagnoses:  Hematuria, unspecified type  History of kidney stones  Right flank pain  Nausea  Hydroureteronephrosis     Discharge Instructions      Advised patient of CT renal results with hardcopy provided to patient.  Advised patient to take Flomax daily in the morning.  Encouraged increase in daily water intake to 64 ounces per day.  Advised may use Toradol for flank pain/ureteral/kidney stone pain every 6 hours.  Advised may use Zofran as needed for nausea.  Advised patient if symptoms worsen and/or unresolved please follow-up with PCP, [place] [facility] or here for further evaluation.     ED Prescriptions     Medication Sig Dispense Auth. Provider   ketorolac (TORADOL) 10 MG tablet Take 1 tablet (10 mg total) by mouth every 6 (six) hours as needed. 30 tablet Trevor Iha, FNP   tamsulosin (FLOMAX) 0.4 MG CAPS capsule Take 1 capsule (0.4 mg total) by mouth daily. 30 capsule Trevor Iha, FNP   ondansetron (ZOFRAN-ODT) 8 MG disintegrating tablet Take 1 tablet (8 mg total) by mouth every 8 (eight) hours as needed for nausea or vomiting. 24 tablet Trevor Iha, FNP      PDMP not reviewed this encounter.   Trevor Iha, FNP 01/22/23 1319

## 2023-01-22 NOTE — Discharge Instructions (Addendum)
Advised patient of CT renal results with hardcopy provided to patient.  Advised patient to take Flomax daily in the morning.  Encouraged increase in daily water intake to 64 ounces per day.  Advised may use Toradol for flank pain/ureteral/kidney stone pain every 6 hours.  Advised may use Zofran as needed for nausea.  Advised patient if symptoms worsen and/or unresolved please follow-up with PCP, [place] [facility] or here for further evaluation.

## 2023-01-23 LAB — URINE CULTURE: Culture: <10000 — AB

## 2023-02-05 ENCOUNTER — Encounter: Payer: Self-pay | Admitting: Family Medicine

## 2023-02-05 ENCOUNTER — Ambulatory Visit: Payer: BC Managed Care – PPO | Admitting: Family Medicine

## 2023-02-05 VITALS — BP 121/74 | HR 76 | Temp 98.0°F | Ht 68.0 in | Wt 200.0 lb

## 2023-02-05 DIAGNOSIS — E1165 Type 2 diabetes mellitus with hyperglycemia: Secondary | ICD-10-CM | POA: Diagnosis not present

## 2023-02-05 DIAGNOSIS — I700 Atherosclerosis of aorta: Secondary | ICD-10-CM | POA: Diagnosis not present

## 2023-02-05 DIAGNOSIS — N200 Calculus of kidney: Secondary | ICD-10-CM | POA: Diagnosis not present

## 2023-02-05 DIAGNOSIS — M545 Low back pain, unspecified: Secondary | ICD-10-CM

## 2023-02-05 DIAGNOSIS — G8929 Other chronic pain: Secondary | ICD-10-CM

## 2023-02-05 LAB — POCT GLYCOSYLATED HEMOGLOBIN (HGB A1C): Hemoglobin A1C: 5.5 % (ref 4.0–5.6)

## 2023-02-05 MED ORDER — CYCLOBENZAPRINE HCL 5 MG PO TABS: 5.0000 mg | ORAL_TABLET | Freq: Two times a day (BID) | ORAL | 0 refills | Status: DC | PRN

## 2023-02-05 NOTE — Assessment & Plan Note (Signed)
-   seen on CT renal - will get lipid today to see if we need to increase lipitor since he is currently one 10mg

## 2023-02-05 NOTE — Assessment & Plan Note (Signed)
Ct showed mild degenerative disc disease. Will refill flexiril

## 2023-02-05 NOTE — Progress Notes (Signed)
Established patient visit   Patient: Carlos Potts   DOB: May 26, 1966   57 y.o. Male  MRN: [id number] Visit Date: 02/05/2023  Today's healthcare provider: Charlton Amor, DO   Chief Complaint  Patient presents with   Diabetes    SUBJECTIVE    Chief Complaint  Patient presents with   Diabetes   HPI   Pt presents for T2DM follow up. Last A1c was 5.4. He was taken off rybelsus at last visit as a trial due to decreased A1c. He is do for A1c repeat today. He has increased exercise and increased protein in foods. Currently on atorvastatin 10mg . No side effects.   Seen in urgent care a few weeks ago for kidney stones. Was treated with flomax and ketoralac. Denies any pain.  Review of Systems  Constitutional:  Negative for activity change, fatigue and fever.  Respiratory:  Negative for cough and shortness of breath.   Cardiovascular:  Negative for chest pain.  Gastrointestinal:  Negative for abdominal pain.  Genitourinary:  Negative for difficulty urinating.       Current Meds  Medication Sig   atorvastatin (LIPITOR) 10 MG tablet Take 1 tablet (10 mg total) by mouth daily.   ketorolac (TORADOL) 10 MG tablet Take 1 tablet (10 mg total) by mouth every 6 (six) hours as needed.   tamsulosin (FLOMAX) 0.4 MG CAPS capsule Take 1 capsule (0.4 mg total) by mouth daily.   [DISCONTINUED] cyclobenzaprine (FLEXERIL) 5 MG tablet Take 1-2 tablets (5-10 mg total) by mouth 2 (two) times daily as needed for muscle spasms.    OBJECTIVE    BP 121/74 (BP Location: Left Arm, Patient Position: Sitting, Cuff Size: Normal)   Pulse 76   Temp 98 F (36.7 C) (Oral)   Ht 5\' 8"  (1.727 m)   Wt 200 lb 0.6 oz (90.7 kg)   SpO2 97%   BMI 30.42 kg/m   Physical Exam Vitals and nursing note reviewed.  Constitutional:      General: He is not in acute distress.    Appearance: Normal appearance.  HENT:     Head: Normocephalic and atraumatic.     Right Ear: External ear normal.     Left Ear:  External ear normal.     Nose: Nose normal.  Eyes:     Conjunctiva/sclera: Conjunctivae normal.  Cardiovascular:     Rate and Rhythm: Normal rate and regular rhythm.  Pulmonary:     Effort: Pulmonary effort is normal.     Breath sounds: Normal breath sounds.  Neurological:     General: No focal deficit present.     Mental Status: He is alert and oriented to person, place, and time.  Psychiatric:        Mood and Affect: Mood normal.        Behavior: Behavior normal.        Thought Content: Thought content normal.        Judgment: Judgment normal.        ASSESSMENT & PLAN    Problem List Items Addressed This Visit       Cardiovascular and Mediastinum   Aortic atherosclerosis (HCC)    - seen on CT renal - will get lipid today to see if we need to increase lipitor since he is currently one 10mg          Endocrine   Type 2 diabetes mellitus with hyperglycemia, without long-term current use of insulin (HCC) - Primary    - poc  A1c done today and is 5.5 - encouraged pt to increase exercise. He has gained 20lbs since stopping rybelsus. Recommended we re-start rybelsus however patient would like to do a three month trial of exercise  - recommended decreasing pepsi - pt will like to hold off on rybelsus for 3 more months and we will re-eval        Relevant Orders   POCT HgB A1C   Lipid panel   COMPLETE METABOLIC PANEL WITH GFR   Ambulatory referral to Ophthalmology     Genitourinary   Kidney stone    - resolved. Recommended cutting back on pepsi since I do believe this was an exacerbating factor. Said he strained his urine but did not get anything         Other   Bilateral low back pain without sciatica    Ct showed mild degenerative disc disease. Will refill flexiril       Relevant Medications   cyclobenzaprine (FLEXERIL) 5 MG tablet    Return in about 3 months (around 05/08/2023).      Meds ordered this encounter  Medications   cyclobenzaprine (FLEXERIL) 5 MG  tablet    Sig: Take 1-2 tablets (5-10 mg total) by mouth 2 (two) times daily as needed for muscle spasms.    Dispense:  30 tablet    Refill:  0    Orders Placed This Encounter  Procedures   Lipid panel    Order Specific Question:   Has the patient fasted?    Answer:   No    Order Specific Question:   Release to patient    Answer:   Immediate   COMPLETE METABOLIC PANEL WITH GFR   Ambulatory referral to Ophthalmology    Referral Priority:   Routine    Referral Type:   Consultation    Referral Reason:   Specialty Services Required    Requested Specialty:   Ophthalmology    Number of Visits Requested:   1   POCT HgB A1C     Charlton Amor, DO  [facility] at [facility] [phone] (phone) [phone] (fax)  [facility]

## 2023-02-05 NOTE — Assessment & Plan Note (Signed)
-   resolved. Recommended cutting back on pepsi since I do believe this was an exacerbating factor. Said he strained his urine but did not get anything

## 2023-02-05 NOTE — Assessment & Plan Note (Addendum)
-   poc A1c done today and is 5.5 - encouraged pt to increase exercise. He has gained 20lbs since stopping rybelsus. Recommended we re-start rybelsus however patient would like to do a three month trial of exercise  - recommended decreasing pepsi - pt will like to hold off on rybelsus for 3 more months and we will re-eval

## 2023-05-08 ENCOUNTER — Ambulatory Visit: Payer: BC Managed Care – PPO | Admitting: Family Medicine

## 2023-05-08 NOTE — Progress Notes (Deleted)
Established patient visit   Patient: Carlos Potts   DOB: Jan 22, 1966   57 y.o. Male  MRN: [id number] Visit Date: 05/08/2023  Today's healthcare provider: Charlton Amor, DO   No chief complaint on file.   SUBJECTIVE   No chief complaint on file.  HPI   Pt presents today to follow up on T2DM. He wanted to do a trial off rybelsus at last visit. Will need an A1c today to look at next steps.  Review of Systems  Constitutional:  Negative for activity change, fatigue and fever.  Respiratory:  Negative for cough and shortness of breath.   Cardiovascular:  Negative for chest pain.  Gastrointestinal:  Negative for abdominal pain.  Genitourinary:  Negative for difficulty urinating.       No outpatient medications have been marked as taking for the 05/08/23 encounter (Appointment) with Charlton Amor, DO.    OBJECTIVE    There were no vitals taken for this visit.  Physical Exam Vitals and nursing note reviewed.  Constitutional:      General: He is not in acute distress.    Appearance: Normal appearance.  HENT:     Head: Normocephalic and atraumatic.     Right Ear: External ear normal.     Left Ear: External ear normal.     Nose: Nose normal.  Eyes:     Conjunctiva/sclera: Conjunctivae normal.  Cardiovascular:     Rate and Rhythm: Normal rate and regular rhythm.  Pulmonary:     Effort: Pulmonary effort is normal.     Breath sounds: Normal breath sounds.  Neurological:     General: No focal deficit present.     Mental Status: He is alert and oriented to person, place, and time.  Psychiatric:        Mood and Affect: Mood normal.        Behavior: Behavior normal.        Thought Content: Thought content normal.        Judgment: Judgment normal.        ASSESSMENT & PLAN    Problem List Items Addressed This Visit   None   No follow-ups on file.      No orders of the defined types were placed in this encounter.   No orders of the defined types were  placed in this encounter.    Charlton Amor, DO  [facility] at [facility] [phone] (phone) [phone] (fax)  [facility]

## 2023-12-03 ENCOUNTER — Other Ambulatory Visit (HOSPITAL_COMMUNITY): Payer: Self-pay

## 2024-01-02 ENCOUNTER — Encounter: Payer: Self-pay | Admitting: Family Medicine

## 2024-03-30 ENCOUNTER — Telehealth: Payer: Self-pay | Admitting: Family Medicine

## 2024-03-30 NOTE — Telephone Encounter (Unsigned)
 Copied from CRM [id number]. Topic: Appointments - Appointment Scheduling >> Mar 30, 2024 10:52 AM Marda MATSU wrote: Pt Faught would like  a DO, provider. Is it possible Dr Alvia would accept him as a patient since Dr Gracie left the practice.  Please advise

## 2024-04-28 ENCOUNTER — Encounter: Payer: Self-pay | Admitting: Sports Medicine

## 2024-05-07 ENCOUNTER — Ambulatory Visit: Admitting: Urgent Care

## 2024-05-07 ENCOUNTER — Encounter: Payer: Self-pay | Admitting: Urgent Care

## 2024-05-07 VITALS — BP 126/77 | HR 69 | Ht 68.0 in | Wt 233.0 lb

## 2024-05-07 DIAGNOSIS — M19041 Primary osteoarthritis, right hand: Secondary | ICD-10-CM

## 2024-05-07 DIAGNOSIS — E7800 Pure hypercholesterolemia, unspecified: Secondary | ICD-10-CM | POA: Diagnosis not present

## 2024-05-07 DIAGNOSIS — H9313 Tinnitus, bilateral: Secondary | ICD-10-CM

## 2024-05-07 DIAGNOSIS — E66811 Obesity, class 1: Secondary | ICD-10-CM | POA: Diagnosis not present

## 2024-05-07 DIAGNOSIS — Z125 Encounter for screening for malignant neoplasm of prostate: Secondary | ICD-10-CM | POA: Diagnosis not present

## 2024-05-07 DIAGNOSIS — Z7984 Long term (current) use of oral hypoglycemic drugs: Secondary | ICD-10-CM

## 2024-05-07 DIAGNOSIS — M19042 Primary osteoarthritis, left hand: Secondary | ICD-10-CM

## 2024-05-07 DIAGNOSIS — M545 Low back pain, unspecified: Secondary | ICD-10-CM

## 2024-05-07 DIAGNOSIS — G8929 Other chronic pain: Secondary | ICD-10-CM

## 2024-05-07 DIAGNOSIS — E1165 Type 2 diabetes mellitus with hyperglycemia: Secondary | ICD-10-CM

## 2024-05-07 MED ORDER — CYCLOBENZAPRINE HCL 5 MG PO TABS
5.0000 mg | ORAL_TABLET | Freq: Two times a day (BID) | ORAL | 11 refills | Status: AC | PRN
Start: 2024-05-07 — End: ?

## 2024-05-07 NOTE — Progress Notes (Unsigned)
 Established Patient Office Visit  Subjective:  Patient ID: Carlos Potts, male    DOB: [birth year]  Age: 58 y.o. MRN: [id number]  Chief Complaint  Patient presents with   transfer of care    Rybelsus  and metformin  xr -  Lost 70# in 4 months previously     Patient Active Problem List   Diagnosis Date Noted   Aortic atherosclerosis (HCC) 02/05/2023   COVID 10/03/2022   Impingement syndrome of both shoulders 07/31/2022   Type 2 diabetes mellitus with hyperglycemia, without long-term current use of insulin (HCC) 07/16/2022   Other fatigue 07/16/2022   Obesity (BMI 30.0-34.9) 07/16/2022   Trochanteric bursitis of both hips 07/16/2022   Acquired trigger finger of both index fingers 06/19/2022   Prediabetes 06/19/2022   Benign colon polyp - colonoscopy 11/2019 04/25/2021   Post herpetic neuralgia 10/24/2018   History of shingles 10/24/2018   Bilateral high frequency sensorineural hearing loss 09/20/2017   Tinnitus of both ears 09/10/2017   Insomnia 09/10/2017   Kidney stone    Chalazion left lower eyelid 07/27/2016   Osteoarthritis of both hands 07/27/2016   Rhinitis, allergic 03/28/2016   Bilateral low back pain without sciatica 03/28/2016   Adult BMI > 30 03/28/2016   Past Medical History:  Diagnosis Date   Allergy Over 20 years ago   Alergies to grasses   Arthritis    FINGERS   Back ache    GERD (gastroesophageal reflux disease)    History of kidney stones    History of shingles 10/24/2018   08/2018   Hyperlipidemia 1022   Seasonal allergies    Tinnitus    Past Surgical History:  Procedure Laterality Date   CYSTOSCOPY/URETEROSCOPY/HOLMIUM LASER/STENT PLACEMENT Left 08/23/2017   Procedure: CYSTOSCOPY/RETROGRADE/URETEROSCOPY/HOLMIUM LASER/STENT PLACEMENT;  Surgeon: Renda Glance, MD;  Location: [facility];  Service: Urology;  Laterality: Left;   EXTRACORPOREAL SHOCK WAVE LITHOTRIPSY Left 08/12/2017   Procedure: LEFT EXTRACORPOREAL SHOCK WAVE LITHOTRIPSY (ESWL);   Surgeon: Alvaro Hummer, MD;  Location: [facility];  Service: Urology;  Laterality: Left;   STENT TO LEFT KINEY  08/17/2017   IN [place]    TONSILLECTOMY  AGE 26   Social History   Tobacco Use   Smoking status: Never    Passive exposure: Never   Smokeless tobacco: Never  Vaping Use   Vaping status: Never Used  Substance Use Topics   Alcohol use: No   Drug use: No      ROS: as noted in HPI  Objective:     BP 126/77   Pulse 69   Ht 5' 8 (1.727 m)   Wt 233 lb (105.7 kg)   SpO2 96%   BMI 35.43 kg/m  BP Readings from Last 3 Encounters:  05/07/24 126/77  02/05/23 121/74  01/22/23 (!) 159/80   Wt Readings from Last 3 Encounters:  05/07/24 233 lb (105.7 kg)  02/05/23 200 lb 0.6 oz (90.7 kg)  11/05/22 189 lb 0.6 oz (85.7 kg)      Physical Exam   No results found for any visits on 05/07/24.  Last CBC Lab Results  Component Value Date   WBC 6.6 06/19/2022   HGB 15.7 06/19/2022   HCT 45.4 06/19/2022   MCV 90.6 06/19/2022   MCH 31.3 06/19/2022   RDW 12.9 06/19/2022   PLT 224 06/19/2022   Last metabolic panel Lab Results  Component Value Date   GLUCOSE 132 (H) 06/19/2022   NA 137 06/19/2022   K 4.4 06/19/2022   CL 103  06/19/2022   CO2 25 06/19/2022   BUN 12 06/19/2022   CREATININE 0.87 06/19/2022   EGFR 100 06/19/2022   CALCIUM  9.4 06/19/2022   PROT 7.1 06/19/2022   ALBUMIN 4.5 08/12/2017   BILITOT 0.5 06/19/2022   ALKPHOS 73 08/12/2017   AST 33 06/19/2022   ALT 64 (H) 06/19/2022   ANIONGAP 11 08/12/2017   Last lipids Lab Results  Component Value Date   CHOL 155 04/26/2021   HDL 44 04/26/2021   LDLCALC 90 04/26/2021   TRIG 114 04/26/2021   CHOLHDL 3.5 04/26/2021   Last hemoglobin A1c Lab Results  Component Value Date   HGBA1C 5.5 02/05/2023   Last thyroid  functions Lab Results  Component Value Date   TSH 4.18 04/26/2021   Last vitamin D No results found for: 25OHVITD2, 25OHVITD3, VD25OH Last vitamin B12 and Folate No  results found for: VITAMINB12, FOLATE    The ASCVD Risk score (Arnett DK, et al., 2019) failed to calculate for the following reasons:   Cannot find a previous HDL lab   Cannot find a previous total cholesterol lab  Assessment & Plan:  There are no diagnoses linked to this encounter.   No follow-ups on file.   Benton LITTIE Gave, PA

## 2024-05-07 NOTE — Patient Instructions (Addendum)
 We updated your labs today. Please return for a pneumonia vaccine - nurse visit  Please schedule a diabetic retinopathy exam.   Please schedule a 4 month follow up.

## 2024-05-08 ENCOUNTER — Ambulatory Visit: Payer: Self-pay | Admitting: Urgent Care

## 2024-05-08 DIAGNOSIS — E1165 Type 2 diabetes mellitus with hyperglycemia: Secondary | ICD-10-CM

## 2024-05-08 LAB — LIPID PANEL
Chol/HDL Ratio: 4 ratio (ref 0.0–5.0)
Cholesterol, Total: 162 mg/dL (ref 100–199)
HDL: 41 mg/dL (ref >39)
LDL Chol Calc (NIH): 100 mg/dL — ABNORMAL HIGH (ref 0–99)
Triglycerides: 116 mg/dL (ref 0–149)
VLDL Cholesterol Cal: 21 mg/dL (ref 5–40)

## 2024-05-08 LAB — CBC WITH DIFFERENTIAL/PLATELET
Basophils Absolute: 0.1 x10E3/uL (ref 0.0–0.2)
Basos: 1 %
EOS (ABSOLUTE): 0.5 x10E3/uL — ABNORMAL HIGH (ref 0.0–0.4)
Eos: 6 %
Hematocrit: 48.6 % (ref 37.5–51.0)
Hemoglobin: 15.6 g/dL (ref 13.0–17.7)
Immature Grans (Abs): 0 x10E3/uL (ref 0.0–0.1)
Immature Granulocytes: 0 %
Lymphocytes Absolute: 2.9 x10E3/uL (ref 0.7–3.1)
Lymphs: 38 %
MCH: 30.2 pg (ref 26.6–33.0)
MCHC: 32.1 g/dL (ref 31.5–35.7)
MCV: 94 fL (ref 79–97)
Monocytes Absolute: 0.6 x10E3/uL (ref 0.1–0.9)
Monocytes: 7 %
Neutrophils Absolute: 3.7 x10E3/uL (ref 1.4–7.0)
Neutrophils: 48 %
Platelets: 249 x10E3/uL (ref 150–450)
RBC: 5.16 x10E6/uL (ref 4.14–5.80)
RDW: 13.4 % (ref 11.6–15.4)
WBC: 7.7 x10E3/uL (ref 3.4–10.8)

## 2024-05-08 LAB — COMPREHENSIVE METABOLIC PANEL WITH GFR
ALT: 42 IU/L (ref 0–44)
AST: 33 IU/L (ref 0–40)
Albumin: 4.6 g/dL (ref 3.8–4.9)
Alkaline Phosphatase: 89 IU/L (ref 44–121)
BUN/Creatinine Ratio: 14 (ref 10–24)
BUN: 13 mg/dL (ref 8–27)
Bilirubin Total: 0.5 mg/dL (ref 0.0–1.2)
CO2: 24 mmol/L (ref 20–29)
Calcium: 9.7 mg/dL (ref 8.6–10.2)
Chloride: 97 mmol/L (ref 96–106)
Creatinine, Ser: 0.94 mg/dL (ref 0.76–1.27)
Globulin, Total: 2.6 g/dL (ref 1.5–4.5)
Glucose: 84 mg/dL (ref 70–99)
Potassium: 4.5 mmol/L (ref 3.5–5.2)
Sodium: 137 mmol/L (ref 134–144)
Total Protein: 7.2 g/dL (ref 6.0–8.5)
eGFR: 93 mL/min/1.73 (ref >59)

## 2024-05-08 LAB — PSA: Prostate Specific Ag, Serum: 1.2 ng/mL (ref 0.0–4.0)

## 2024-05-08 LAB — MICROALBUMIN / CREATININE URINE RATIO
Creatinine, Urine: 120.5 mg/dL
Microalb/Creat Ratio: <2 mg/g{creat} (ref 0–29)
Microalbumin, Urine: <3 ug/mL

## 2024-05-08 LAB — HEMOGLOBIN A1C
Est. average glucose Bld gHb Est-mCnc: 137 mg/dL
Hgb A1c MFr Bld: 6.4 % — ABNORMAL HIGH (ref 4.8–5.6)

## 2024-05-08 LAB — TSH: TSH: 3.03 u[IU]/mL (ref 0.450–4.500)

## 2024-05-08 MED ORDER — RYBELSUS 3 MG PO TABS
3.0000 mg | ORAL_TABLET | Freq: Every day | ORAL | 0 refills | Status: AC
Start: 2024-05-08 — End: ?

## 2024-05-12 ENCOUNTER — Other Ambulatory Visit (HOSPITAL_COMMUNITY): Payer: Self-pay

## 2024-05-12 ENCOUNTER — Telehealth: Payer: Self-pay

## 2024-05-12 NOTE — Telephone Encounter (Signed)
 Pharmacy Patient Advocate Encounter   Received notification from CoverMyMeds that prior authorization for Ryblesus 3mg  tabs is required/requested.   Insurance verification completed.   The patient is insured through [facility] .   Per test claim: PA required; PA submitted to above mentioned insurance via Latent Key/confirmation #/EOC [id number] Status is pending

## 2024-05-13 ENCOUNTER — Other Ambulatory Visit (HOSPITAL_COMMUNITY): Payer: Self-pay

## 2024-05-13 NOTE — Telephone Encounter (Signed)
 Pharmacy Patient Advocate Encounter  Received notification from [facility] that Prior Authorization for Rybelsus  3mg  tabs has been APPROVED from 05/12/24 to 05/12/25   PA #/Case ID/Reference #: [id number]

## 2024-05-18 DIAGNOSIS — J019 Acute sinusitis, unspecified: Secondary | ICD-10-CM | POA: Diagnosis not present

## 2024-06-12 NOTE — Progress Notes (Signed)
 Carlos Potts                                          MRN: [id number]   06/12/2024   The [facility] Quality Team Specialist reviewed this patient medical record for the purposes of chart review for care gap closure. The following were reviewed: abstraction for care gap closure-glycemic status assessment.    [facility] Quality Team

## 2024-06-29 DIAGNOSIS — E119 Type 2 diabetes mellitus without complications: Secondary | ICD-10-CM | POA: Diagnosis not present

## 2024-06-29 DIAGNOSIS — H527 Unspecified disorder of refraction: Secondary | ICD-10-CM | POA: Diagnosis not present

## 2024-06-29 DIAGNOSIS — H3343 Traction detachment of retina, bilateral: Secondary | ICD-10-CM | POA: Diagnosis not present

## 2024-09-07 ENCOUNTER — Ambulatory Visit: Admitting: Urgent Care
